# Patient Record
Sex: Female | Born: 1935 | Race: Black or African American | Hispanic: No | Smoking: Never smoker
Health system: Southern US, Community
[De-identification: ages and names within clinical notes are randomized; demographics above are authoritative.]

## PROBLEM LIST (undated history)

## (undated) DIAGNOSIS — E119 Type 2 diabetes mellitus without complications: Secondary | ICD-10-CM

## (undated) DIAGNOSIS — I5021 Acute systolic (congestive) heart failure: Secondary | ICD-10-CM

## (undated) DIAGNOSIS — I739 Peripheral vascular disease, unspecified: Secondary | ICD-10-CM

## (undated) DIAGNOSIS — M86179 Other acute osteomyelitis, unspecified ankle and foot: Secondary | ICD-10-CM

## (undated) DIAGNOSIS — E876 Hypokalemia: Secondary | ICD-10-CM

## (undated) DIAGNOSIS — I10 Essential (primary) hypertension: Secondary | ICD-10-CM

## (undated) DIAGNOSIS — F329 Major depressive disorder, single episode, unspecified: Secondary | ICD-10-CM

## (undated) DIAGNOSIS — F32A Depression, unspecified: Secondary | ICD-10-CM

## (undated) DIAGNOSIS — F419 Anxiety disorder, unspecified: Secondary | ICD-10-CM

## (undated) DIAGNOSIS — F0390 Unspecified dementia without behavioral disturbance: Secondary | ICD-10-CM

## (undated) DIAGNOSIS — E11610 Type 2 diabetes mellitus with diabetic neuropathic arthropathy: Secondary | ICD-10-CM

## (undated) DIAGNOSIS — E039 Hypothyroidism, unspecified: Secondary | ICD-10-CM

## (undated) HISTORY — DX: Hypokalemia: E87.6

## (undated) HISTORY — DX: Peripheral vascular disease, unspecified: I73.9

## (undated) HISTORY — DX: Type 2 diabetes mellitus without complications: E11.9

## (undated) HISTORY — PX: OTHER SURGICAL HISTORY: SHX169

## (undated) HISTORY — DX: Acute systolic (congestive) heart failure: I50.21

## (undated) HISTORY — DX: Essential (primary) hypertension: I10

## (undated) HISTORY — DX: Major depressive disorder, single episode, unspecified: F32.9

## (undated) HISTORY — DX: Depression, unspecified: F32.A

## (undated) HISTORY — PX: RECTAL POLYPECTOMY: SHX2309

## (undated) HISTORY — DX: Type 2 diabetes mellitus with diabetic neuropathic arthropathy: E11.610

## (undated) HISTORY — DX: Unspecified dementia, unspecified severity, without behavioral disturbance, psychotic disturbance, mood disturbance, and anxiety: F03.90

## (undated) HISTORY — DX: Anxiety disorder, unspecified: F41.9

## (undated) HISTORY — DX: Other acute osteomyelitis, unspecified ankle and foot: M86.179

## (undated) HISTORY — DX: Hypothyroidism, unspecified: E03.9

---

## 1970-09-20 HISTORY — PX: ENUCLEATION: SHX628

## 1998-08-10 ENCOUNTER — Emergency Department (HOSPITAL_COMMUNITY): Admission: EM | Admit: 1998-08-10 | Discharge: 1998-08-10 | Payer: Self-pay | Admitting: Emergency Medicine

## 2007-01-23 ENCOUNTER — Inpatient Hospital Stay (HOSPITAL_COMMUNITY): Admission: EM | Admit: 2007-01-23 | Discharge: 2007-01-27 | Payer: Self-pay | Admitting: Emergency Medicine

## 2007-01-24 ENCOUNTER — Encounter (INDEPENDENT_AMBULATORY_CARE_PROVIDER_SITE_OTHER): Payer: Self-pay | Admitting: Cardiology

## 2007-01-24 ENCOUNTER — Ambulatory Visit: Payer: Self-pay | Admitting: Vascular Surgery

## 2007-01-24 ENCOUNTER — Encounter: Payer: Self-pay | Admitting: Vascular Surgery

## 2007-09-19 ENCOUNTER — Encounter (HOSPITAL_BASED_OUTPATIENT_CLINIC_OR_DEPARTMENT_OTHER): Admission: RE | Admit: 2007-09-19 | Discharge: 2007-10-05 | Payer: Self-pay | Admitting: Surgery

## 2007-10-06 ENCOUNTER — Encounter (HOSPITAL_BASED_OUTPATIENT_CLINIC_OR_DEPARTMENT_OTHER): Admission: RE | Admit: 2007-10-06 | Discharge: 2008-01-04 | Payer: Self-pay | Admitting: Internal Medicine

## 2008-01-09 ENCOUNTER — Encounter (HOSPITAL_BASED_OUTPATIENT_CLINIC_OR_DEPARTMENT_OTHER): Admission: RE | Admit: 2008-01-09 | Discharge: 2008-04-08 | Payer: Self-pay | Admitting: Surgery

## 2008-04-17 ENCOUNTER — Ambulatory Visit (HOSPITAL_COMMUNITY): Admission: RE | Admit: 2008-04-17 | Discharge: 2008-04-17 | Payer: Self-pay | Admitting: Surgery

## 2008-04-17 ENCOUNTER — Encounter (HOSPITAL_BASED_OUTPATIENT_CLINIC_OR_DEPARTMENT_OTHER): Admission: RE | Admit: 2008-04-17 | Discharge: 2008-06-06 | Payer: Self-pay | Admitting: Internal Medicine

## 2008-05-29 ENCOUNTER — Ambulatory Visit: Payer: Self-pay | Admitting: Vascular Surgery

## 2008-06-07 ENCOUNTER — Ambulatory Visit (HOSPITAL_COMMUNITY): Admission: RE | Admit: 2008-06-07 | Discharge: 2008-06-07 | Payer: Self-pay | Admitting: Vascular Surgery

## 2009-06-25 ENCOUNTER — Inpatient Hospital Stay (HOSPITAL_COMMUNITY): Admission: EM | Admit: 2009-06-25 | Discharge: 2009-06-30 | Payer: Self-pay | Admitting: Emergency Medicine

## 2009-06-26 ENCOUNTER — Encounter (INDEPENDENT_AMBULATORY_CARE_PROVIDER_SITE_OTHER): Payer: Self-pay | Admitting: Internal Medicine

## 2009-06-26 ENCOUNTER — Ambulatory Visit: Payer: Self-pay | Admitting: Surgery

## 2009-06-26 ENCOUNTER — Ambulatory Visit: Payer: Self-pay | Admitting: Infectious Disease

## 2009-06-29 ENCOUNTER — Ambulatory Visit: Payer: Self-pay | Admitting: Infectious Disease

## 2009-07-02 ENCOUNTER — Encounter: Payer: Self-pay | Admitting: Infectious Disease

## 2009-07-07 ENCOUNTER — Encounter: Payer: Self-pay | Admitting: Infectious Disease

## 2009-07-09 ENCOUNTER — Telehealth: Payer: Self-pay | Admitting: Infectious Disease

## 2009-07-14 ENCOUNTER — Ambulatory Visit: Payer: Self-pay | Admitting: Cardiology

## 2009-07-14 ENCOUNTER — Inpatient Hospital Stay (HOSPITAL_COMMUNITY): Admission: EM | Admit: 2009-07-14 | Discharge: 2009-07-19 | Payer: Self-pay | Admitting: Emergency Medicine

## 2009-07-14 ENCOUNTER — Encounter: Payer: Self-pay | Admitting: Infectious Disease

## 2009-07-15 ENCOUNTER — Encounter: Payer: Self-pay | Admitting: Cardiology

## 2009-07-16 ENCOUNTER — Encounter: Payer: Self-pay | Admitting: Infectious Disease

## 2009-07-21 ENCOUNTER — Encounter: Payer: Self-pay | Admitting: Cardiology

## 2009-07-21 ENCOUNTER — Encounter: Payer: Self-pay | Admitting: Infectious Disease

## 2009-07-24 ENCOUNTER — Encounter: Payer: Self-pay | Admitting: Infectious Disease

## 2009-07-24 ENCOUNTER — Telehealth: Payer: Self-pay | Admitting: Infectious Disease

## 2009-07-25 ENCOUNTER — Encounter: Payer: Self-pay | Admitting: Cardiology

## 2009-07-29 ENCOUNTER — Encounter: Payer: Self-pay | Admitting: Infectious Disease

## 2009-07-30 DIAGNOSIS — M86179 Other acute osteomyelitis, unspecified ankle and foot: Secondary | ICD-10-CM | POA: Insufficient documentation

## 2009-08-01 DIAGNOSIS — I428 Other cardiomyopathies: Secondary | ICD-10-CM | POA: Insufficient documentation

## 2009-08-01 DIAGNOSIS — E039 Hypothyroidism, unspecified: Secondary | ICD-10-CM | POA: Insufficient documentation

## 2009-08-01 DIAGNOSIS — I739 Peripheral vascular disease, unspecified: Secondary | ICD-10-CM | POA: Insufficient documentation

## 2009-08-01 DIAGNOSIS — E119 Type 2 diabetes mellitus without complications: Secondary | ICD-10-CM

## 2009-08-01 DIAGNOSIS — I10 Essential (primary) hypertension: Secondary | ICD-10-CM | POA: Insufficient documentation

## 2009-08-01 DIAGNOSIS — I5021 Acute systolic (congestive) heart failure: Secondary | ICD-10-CM | POA: Insufficient documentation

## 2009-08-01 DIAGNOSIS — Z862 Personal history of diseases of the blood and blood-forming organs and certain disorders involving the immune mechanism: Secondary | ICD-10-CM | POA: Insufficient documentation

## 2009-08-01 DIAGNOSIS — F068 Other specified mental disorders due to known physiological condition: Secondary | ICD-10-CM

## 2009-08-01 DIAGNOSIS — F341 Dysthymic disorder: Secondary | ICD-10-CM | POA: Insufficient documentation

## 2009-08-01 DIAGNOSIS — Z8639 Personal history of other endocrine, nutritional and metabolic disease: Secondary | ICD-10-CM

## 2009-08-04 ENCOUNTER — Encounter: Payer: Self-pay | Admitting: Infectious Disease

## 2009-08-05 ENCOUNTER — Encounter: Payer: Self-pay | Admitting: Infectious Disease

## 2009-08-11 ENCOUNTER — Encounter: Payer: Self-pay | Admitting: Infectious Disease

## 2009-08-13 ENCOUNTER — Encounter: Payer: Self-pay | Admitting: Infectious Disease

## 2009-08-18 ENCOUNTER — Ambulatory Visit: Payer: Self-pay | Admitting: Infectious Disease

## 2009-08-18 DIAGNOSIS — R197 Diarrhea, unspecified: Secondary | ICD-10-CM | POA: Insufficient documentation

## 2009-08-18 DIAGNOSIS — M86679 Other chronic osteomyelitis, unspecified ankle and foot: Secondary | ICD-10-CM | POA: Insufficient documentation

## 2009-08-18 DIAGNOSIS — M1460 Charcot's joint, unspecified site: Secondary | ICD-10-CM | POA: Insufficient documentation

## 2009-08-18 DIAGNOSIS — E1169 Type 2 diabetes mellitus with other specified complication: Secondary | ICD-10-CM | POA: Insufficient documentation

## 2009-08-19 ENCOUNTER — Encounter: Payer: Self-pay | Admitting: Infectious Disease

## 2009-08-20 ENCOUNTER — Ambulatory Visit: Payer: Self-pay | Admitting: Cardiology

## 2009-08-20 LAB — CONVERTED CEMR LAB
ALT: 10 units/L (ref 0–35)
AST: 15 units/L (ref 0–37)
Albumin: 2.9 g/dL — ABNORMAL LOW (ref 3.5–5.2)
Alkaline Phosphatase: 75 units/L (ref 39–117)
Calcium: 8.3 mg/dL — ABNORMAL LOW (ref 8.4–10.5)
Chloride: 103 meq/L (ref 96–112)
HCT: 31.6 % — ABNORMAL LOW (ref 36.0–46.0)
Lymphocytes Relative: 21 % (ref 12–46)
Lymphs Abs: 2.5 10*3/uL (ref 0.7–4.0)
MCV: 83.6 fL (ref >=78.0)
Monocytes Relative: 9 % (ref 3–12)
Neutrophils Relative %: 66 % (ref 43–77)
Platelets: 520 10*3/uL — ABNORMAL HIGH (ref 150–400)
Potassium: 3.6 meq/L (ref 3.5–5.3)
RBC: 3.78 M/uL — ABNORMAL LOW (ref 3.87–5.11)
Sodium: 138 meq/L (ref 135–145)
Total Protein: 5.9 g/dL — ABNORMAL LOW (ref 6.0–8.3)
WBC: 12.2 10*3/uL — ABNORMAL HIGH (ref 4.0–10.5)

## 2009-08-29 ENCOUNTER — Encounter (HOSPITAL_BASED_OUTPATIENT_CLINIC_OR_DEPARTMENT_OTHER): Admission: RE | Admit: 2009-08-29 | Discharge: 2009-10-08 | Payer: Self-pay | Admitting: Internal Medicine

## 2009-10-06 ENCOUNTER — Ambulatory Visit: Payer: Self-pay | Admitting: Infectious Disease

## 2009-10-06 LAB — CONVERTED CEMR LAB
BUN: 16 mg/dL (ref 6–23)
Basophils Absolute: 0 10*3/uL (ref 0.0–0.1)
Basophils Relative: 0 % (ref 0–1)
Calcium: 9.3 mg/dL (ref 8.4–10.5)
Chloride: 95 meq/L — ABNORMAL LOW (ref 96–112)
Creatinine, Ser: 0.91 mg/dL (ref 0.40–1.20)
Eosinophils Absolute: 0.1 10*3/uL (ref 0.0–0.7)
MCHC: 31.7 g/dL (ref 30.0–36.0)
MCV: 87.8 fL (ref >=78.0)
Monocytes Relative: 7 % (ref 3–12)
Neutrophils Relative %: 68 % (ref 43–77)
Platelets: 391 10*3/uL (ref 150–400)
RBC: 3.92 M/uL (ref 3.87–5.11)
RDW: 15.5 % (ref 11.5–15.5)

## 2009-10-15 ENCOUNTER — Telehealth: Payer: Self-pay | Admitting: Infectious Disease

## 2009-11-03 ENCOUNTER — Inpatient Hospital Stay (HOSPITAL_COMMUNITY): Admission: EM | Admit: 2009-11-03 | Discharge: 2009-11-08 | Payer: Self-pay | Admitting: Internal Medicine

## 2009-11-03 ENCOUNTER — Encounter: Payer: Self-pay | Admitting: Emergency Medicine

## 2009-11-04 ENCOUNTER — Encounter (INDEPENDENT_AMBULATORY_CARE_PROVIDER_SITE_OTHER): Payer: Self-pay | Admitting: Internal Medicine

## 2009-11-05 ENCOUNTER — Ambulatory Visit: Payer: Self-pay | Admitting: Physical Medicine & Rehabilitation

## 2010-07-22 ENCOUNTER — Encounter (HOSPITAL_BASED_OUTPATIENT_CLINIC_OR_DEPARTMENT_OTHER): Admission: RE | Admit: 2010-07-22 | Discharge: 2010-08-07 | Payer: Self-pay | Admitting: Internal Medicine

## 2010-07-29 ENCOUNTER — Inpatient Hospital Stay (HOSPITAL_COMMUNITY): Admission: EM | Admit: 2010-07-29 | Discharge: 2010-08-07 | Payer: Self-pay | Admitting: Emergency Medicine

## 2010-07-30 ENCOUNTER — Encounter (INDEPENDENT_AMBULATORY_CARE_PROVIDER_SITE_OTHER): Payer: Self-pay | Admitting: Internal Medicine

## 2010-07-30 ENCOUNTER — Ambulatory Visit: Payer: Self-pay | Admitting: Surgery

## 2010-08-03 ENCOUNTER — Encounter (INDEPENDENT_AMBULATORY_CARE_PROVIDER_SITE_OTHER): Payer: Self-pay | Admitting: Internal Medicine

## 2010-10-20 NOTE — Letter (Signed)
Summary: Physician Query Form  Physician Query Form   Imported By: Kassie Mends 10/06/2009 09:43:47  _____________________________________________________________________  External Attachment:    Type:   Image     Comment:   External Document

## 2010-10-20 NOTE — Assessment & Plan Note (Signed)
Summary: 6 WEEK CHECK UP/CFB   Visit Type:  Follow-up  CC:  6 week check up.  History of Present Illness: 77 year old with dementia, diabetes, charcot foot admitted to [name] in October with acute flare of chronic osteomyelitis of her dorsal foot on the right. She had superficial swabs obtained and was started on broad spectrum vancomycin and zosyn. I changed her to merrem and vancomycin to cover her broadly for MRSA, pseudomonas and anerobes. In the interval her vancomycin was somehow stopped, but I had continued her on meropenem. She then developed profuse watery diarrhea  and I had the meropenem stopped and tried to convince the pt to come in to the hospital for evaluatoin. I saw her in clinic and treated her empirically with flagyl for c difficile colitiis (presumptive). Her diarrhe improved. She has continued to be followed in wound care clinic and presents to ID clinic today for followup.   Current Allergies (reviewed today): No known allergies  Past History:  Family History: Last updated: 08/01/2009   Mom with diabetes type 2 and Alzheimer's.  Father with   diabetes and Alzheimer's.      Social History: Last updated: 08/18/2009 The patient lives in [place] with her daughter.  She   has no significant tobacco abuse history.  She has a history of alcohol  abuse, quitting in 1992 and remains abstinent.  No illicit drug use or  herbal medication use.   Past Medical History: Current Problems:  CONGESTIVE HEART FAILURE, ACUTE, SYSTOLIC (ICD-428.21) CARDIOMYOPATHY (ICD-425.4)-  Presumed nonischemic PERIPHERAL VASCULAR DISEASE (ICD-443.9) HYPERTENSION, UNSPECIFIED (ICD-401.9) DIABETES MELLITUS, TYPE II (ICD-250.00) HYPOTHYROIDISM (ICD-244.9) DEMENTIA (ICD-294.8) ANXIETY DEPRESSION (ICD-300.4) HYPOKALEMIA, HX OF (ICD-V12.2) ACUTE OSTEOMYELITIS, ANKLE AND FOOT (ICD-730.07) HX OF CHARCOT FOOT presumptive diagnosis of c difficile colitis  Past Surgical History: Reviewed  history from 08/01/2009 and no changes required.  1. Left eye removed in 1972.   2. Rectal polyps removed.   Current Medications (verified): 1)  Pedi-Dri 100000 Unit/gm Powd (Nystatin) .... Apply Three Times Dailiy To Area of Rash, Dispense 30day Supply 2)  Aspir-Low 81 Mg Tbec (Aspirin) .Marland Kitchen.. 1 Tablet By Mouth 3)  Carvedilol 12.5 Mg Tabs (Carvedilol) .... Take One Tablet By Mouth Twice A Day 4)  Digoxin 0.125 Mg Tabs (Digoxin) .Marland Kitchen.. 1 Tablet By Mouth 5)  Lasix 40 Mg Tabs (Furosemide) .Marland Kitchen.. 1 Tablet By Mouth As Needed 6)  Lantus Solostar 100 Unit/ml Soln (Insulin Glargine) 7)  Proair Hfa 108 (90 Base) Mcg/act Aers (Albuterol Sulfate) .... 2 Puffs Four Times Daily As Needed 8)  Potassium Chloride 20 Meq Pack (Potassium Chloride) .... 2 Tablets By Mouth 9)  Acetaminophen 500 Mg Tabs (Acetaminophen) .Marland Kitchen.. 1 Tablet By Mouth Every 6 Hours As Needed 10)  Venlafaxine Hcl 75 Mg Xr24h-Tab (Venlafaxine Hcl) .Marland Kitchen.. 1 Capsule By Mouth Daily 11)  Diphenoxylate-Atropine 2.5-0.025 Mg Tabs (Diphenoxylate-Atropine) .... By Mouth As Needed 12)  Vicodin 5-500 Mg Tabs (Hydrocodone-Acetaminophen) .Marland Kitchen.. 1 Tablet By Mouth Every 6 Hours As Needed  Allergies (verified): No Known Drug Allergies   Family History: Reviewed history from 08/01/2009 and no changes required.   Mom with diabetes type 2 and Alzheimer's.  Father with   diabetes and Alzheimer's.      Social History: Reviewed history from 08/18/2009 and no changes required. The patient lives in [place] with her daughter.  She   has no significant tobacco abuse history.  She has a history of alcohol  abuse, quitting in 1992 and remains abstinent.  No illicit drug  use or  herbal medication use.   Review of Systems  The patient denies anorexia, fever, weight loss, weight gain, vision loss, decreased hearing, hoarseness, chest pain, syncope, dyspnea on exertion, peripheral edema, prolonged cough, headaches, hemoptysis, abdominal pain, melena, hematochezia,  severe indigestion/heartburn, hematuria, incontinence, genital sores, muscle weakness, suspicious skin lesions, transient blindness, difficulty walking, depression, unusual weight change, abnormal bleeding, and enlarged lymph nodes.    Vital Signs:  Patient profile:   77 year old female Height:      57 inches (144.78 cm) Temp:     98.9 degrees F (37.17 degrees C) oral Pulse rate:   102 / minute BP sitting:   111 / 67  (left arm)  Vitals Entered By: Baxter Hire) (October 06, 2009 10:35 AM) CC: 6 week check up Is Patient Diabetic? Yes Did you bring your meter with you today? No Pain Assessment Patient in pain? no      Nutritional Status Detail appetite is eating okay per patient  Does patient need assistance? Functional Status Self care Ambulation Wheelchair   Physical Exam  General:  alert.  alert, well-nourished, and well-hydrated.   Head:  normocephalic and atraumatic.   Eyes:  misssing eye on the right an dlid is chronically shut left eye is reactive to light Ears:  no external deformities and ear piercing(s) noted.   Nose:  no external erythema and no nasal discharge.   Mouth:  pharynx pink and moist, no erythema, and no exudates.  slightly dry mucous membranes Neck:  supple and full ROM.   Lungs:  normal respiratory effort, no crackles, and no wheezes.   Heart:  normal rate, regular rhythm, no murmur, no gallop, and no rub.   Abdomen:  soft, non-tender, normal bowel sounds, no distention, and no masses.   Msk:  she has open wound on her sole of her right foot with granulation tissue but also some malodorous discharge. she has charcot deformities bilaterally Extremities:  see description obove, Neurologic:  alert,  pleasant but memory poor insight poor Skin:  see ms exam section Psych:  pleaseant enough but at times dysphoric anxious appearing   Impression & Recommendations:  Problem # 1:  CHRONIC OSTEOMYELITIS ANKLE AND FOOT (ICD-730.17) Assessment Comment  Only  I have once again laid out options to the pt and daughter continued local wound care with oral and at times parenteral antibiotics when infection flare with risk that one day a severe infection could result in bacteremia and potentially death vs more definitive therapy with BKA with downside of her losing her foot. She is going to be seen by Hattie Perch with orthopedics in next few days. The following medications were removed from the medication list:    Metronidazole 500 Mg Tabs (Metronidazole) .Marland Kitchen... Take 1 tablet by mouth three times a day for 2 weeks Her updated medication list for this problem includes:    Aspir-low 81 Mg Tbec (Aspirin) .Marland Kitchen... 1 tablet by mouth    Acetaminophen 500 Mg Tabs (Acetaminophen) .Marland Kitchen... 1 tablet by mouth every 6 hours as needed    Vicodin 5-500 Mg Tabs (Hydrocodone-acetaminophen) .Marland Kitchen... 1 tablet by mouth every 6 hours as needed  Orders: T-Basic Metabolic Panel [phone]) T-CBC w/Diff [id number]) T-C-Reactive Protein [phone]) T-Sed Rate (Automated) [phone]) Est. Patient Level IV (25956)  Problem # 2:  DIARRHEA (ICD-787.91) Assessment: Comment Only resolved, could have been c difficile The following medications were removed from the medication list:    Metronidazole 500 Mg Tabs (Metronidazole) .Marland Kitchen... Take 1  tablet by mouth three times a day for 2 weeks Her updated medication list for this problem includes:    Diphenoxylate-atropine 2.5-0.025 Mg Tabs (Diphenoxylate-atropine) ..... By mouth as needed  Problem # 3:  HYPERTENSION, UNSPECIFIED (ICD-401.9)  well controlled at present Her updated medication list for this problem includes:    Carvedilol 12.5 Mg Tabs (Carvedilol) .Marland Kitchen... Take one tablet by mouth twice a day    Lasix 40 Mg Tabs (Furosemide) .Marland Kitchen... 1 tablet by mouth as needed  BP today: 111/67 Prior BP: 110/60 (08/20/2009)  Labs Reviewed: K+: 3.6 (08/19/2009) Creat: : 1.08 (08/19/2009)     Orders: Est. Patient Level IV  (30865)  Problem # 4:  DIABETES MELLITUS, TYPE II (ICD-250.00)  controllling important to wound care Her updated medication list for this problem includes:    Aspir-low 81 Mg Tbec (Aspirin) .Marland Kitchen... 1 tablet by mouth    Lantus Solostar 100 Unit/ml Soln (Insulin glargine)  Orders: Est. Patient Level IV (78469)  Problem # 5:  DEMENTIA (ICD-294.8) clearly daughter needs to be involved in major decisions Est. Patient Level IV (62952)  Patient Instructions: 1)  Have Dr. Lajoyce Corners give me a call/send me a note 2)  Continue wound care and I am holidn goff on antibiotics for now pending evaluation by Dr. Lajoyce Corners to discuss surgical options 3)  Make fu appt with Dr. Daiva Eves in 6-8 weeks Process Orders Check Orders Results:     [facility]: Check successful Tests Sent for requisitioning (October 06, 2009 7:56 PM):     10/06/2009: [facility] -- T-Basic Metabolic Panel [phone] (signed)     10/06/2009: [facility] -- T-CBC w/Diff [[id number]] (signed)     10/06/2009: [facility] -- T-C-Reactive Protein [id number] (signed)     10/06/2009: [facility] -- T-Sed Rate (Automated) [[id number]] (signed)

## 2010-10-20 NOTE — Progress Notes (Signed)
Summary: phone note-TY  Phone Note Call from Patient   Caller: Daughter Call For: [name] Dam MD Reason for Call: Acute Illness Summary of Call: Patient's daughter called and stated that the patient has a foul odor coming from the wound x 2 days. She stated that her appt with the wound care center isn't until 10/22/2009. She wants to know does she need to have an appt. with you to examine the wound. She stated the wound doesn't look "that bad" but could use debridment per the daughter. Initial call taken by: Starleen Arms CMA,  October 15, 2009 4:26 PM  Follow-up for Phone Call        I think she may need to go to ED for evaluation. I am not going to be able to squeeze her in tomorrow nor am I going to be able to do so on Monday. I could see her on Wednesday but based on the description and also based on her labs last time I WOULD NOT BE SURPRISED if her chronic infection is getting worse. She probably needs to be admitted for IV antibiotics YET AGAIN. Follow-up by: Acey Lav MD,  October 16, 2009 8:16 PM  Additional Follow-up for Phone Call Additional follow up Details #1::        Patient going to [facility] orthopedics tomorrow, they will evaluate her leg. Additional Follow-up by: Starleen Arms CMA,  October 21, 2009 11:22 AM

## 2010-10-28 ENCOUNTER — Emergency Department (HOSPITAL_COMMUNITY): Payer: Medicare Other

## 2010-10-28 ENCOUNTER — Inpatient Hospital Stay (HOSPITAL_COMMUNITY)
Admission: EM | Admit: 2010-10-28 | Discharge: 2010-11-03 | DRG: 208 | Disposition: A | Discharge status: Home Health | Payer: Medicare Other | Attending: Family Medicine | Admitting: Family Medicine

## 2010-10-28 DIAGNOSIS — I509 Heart failure, unspecified: Secondary | ICD-10-CM | POA: Diagnosis present

## 2010-10-28 DIAGNOSIS — J9600 Acute respiratory failure, unspecified whether with hypoxia or hypercapnia: Principal | ICD-10-CM | POA: Diagnosis present

## 2010-10-28 DIAGNOSIS — E119 Type 2 diabetes mellitus without complications: Secondary | ICD-10-CM | POA: Diagnosis present

## 2010-10-28 DIAGNOSIS — Z794 Long term (current) use of insulin: Secondary | ICD-10-CM

## 2010-10-28 DIAGNOSIS — E875 Hyperkalemia: Secondary | ICD-10-CM | POA: Diagnosis present

## 2010-10-28 DIAGNOSIS — I739 Peripheral vascular disease, unspecified: Secondary | ICD-10-CM | POA: Diagnosis present

## 2010-10-28 DIAGNOSIS — I5023 Acute on chronic systolic (congestive) heart failure: Secondary | ICD-10-CM | POA: Diagnosis present

## 2010-10-28 DIAGNOSIS — F068 Other specified mental disorders due to known physiological condition: Secondary | ICD-10-CM | POA: Diagnosis present

## 2010-10-28 DIAGNOSIS — N179 Acute kidney failure, unspecified: Secondary | ICD-10-CM | POA: Diagnosis present

## 2010-10-28 DIAGNOSIS — S88119A Complete traumatic amputation at level between knee and ankle, unspecified lower leg, initial encounter: Secondary | ICD-10-CM

## 2010-10-28 DIAGNOSIS — Z7982 Long term (current) use of aspirin: Secondary | ICD-10-CM

## 2010-10-28 DIAGNOSIS — J690 Pneumonitis due to inhalation of food and vomit: Secondary | ICD-10-CM | POA: Diagnosis present

## 2010-10-28 DIAGNOSIS — E871 Hypo-osmolality and hyponatremia: Secondary | ICD-10-CM | POA: Diagnosis present

## 2010-10-28 DIAGNOSIS — I214 Non-ST elevation (NSTEMI) myocardial infarction: Secondary | ICD-10-CM | POA: Diagnosis present

## 2010-10-28 DIAGNOSIS — E039 Hypothyroidism, unspecified: Secondary | ICD-10-CM | POA: Diagnosis present

## 2010-10-28 DIAGNOSIS — I428 Other cardiomyopathies: Secondary | ICD-10-CM | POA: Diagnosis present

## 2010-10-28 LAB — POCT I-STAT, CHEM 8
BUN: 12 mg/dL (ref 6–23)
Calcium, Ion: 1.04 mmol/L — ABNORMAL LOW (ref 1.12–1.32)
Chloride: 99 mEq/L (ref 96–112)
HCT: 45 % (ref 36.0–46.0)
Sodium: 125 mEq/L — ABNORMAL LOW (ref 135–145)
TCO2: 20 mmol/L (ref 0–100)

## 2010-10-28 LAB — CBC
HCT: 40.5 % (ref 36.0–46.0)
MCHC: 32.6 g/dL (ref 30.0–36.0)
MCV: 83.7 fL (ref 78.0–100.0)
RDW: 15.1 % (ref 11.5–15.5)

## 2010-10-28 LAB — DIFFERENTIAL
Basophils Absolute: 0 10*3/uL (ref 0.0–0.1)
Eosinophils Absolute: 0.2 10*3/uL (ref 0.0–0.7)
Eosinophils Relative: 2 % (ref 0–5)
Lymphocytes Relative: 46 % (ref 12–46)
Monocytes Absolute: 0.7 10*3/uL (ref 0.1–1.0)

## 2010-10-28 LAB — POCT I-STAT 3, ART BLOOD GAS (G3+)
Acid-base deficit: 6 mmol/L — ABNORMAL HIGH (ref 0.0–2.0)
Bicarbonate: 19.2 mEq/L — ABNORMAL LOW (ref 20.0–24.0)
Patient temperature: 98.6
pH, Arterial: 7.317 — ABNORMAL LOW (ref 7.350–7.400)
pO2, Arterial: 474 mmHg — ABNORMAL HIGH (ref 80.0–100.0)

## 2010-10-28 LAB — POCT CARDIAC MARKERS: Myoglobin, poc: 163 ng/mL (ref 12–200)

## 2010-10-29 ENCOUNTER — Inpatient Hospital Stay (HOSPITAL_COMMUNITY): Payer: Medicare Other

## 2010-10-29 DIAGNOSIS — R7989 Other specified abnormal findings of blood chemistry: Secondary | ICD-10-CM

## 2010-10-29 DIAGNOSIS — I509 Heart failure, unspecified: Secondary | ICD-10-CM

## 2010-10-29 DIAGNOSIS — R55 Syncope and collapse: Secondary | ICD-10-CM

## 2010-10-29 DIAGNOSIS — I369 Nonrheumatic tricuspid valve disorder, unspecified: Secondary | ICD-10-CM

## 2010-10-29 DIAGNOSIS — J9600 Acute respiratory failure, unspecified whether with hypoxia or hypercapnia: Secondary | ICD-10-CM

## 2010-10-29 DIAGNOSIS — I498 Other specified cardiac arrhythmias: Secondary | ICD-10-CM

## 2010-10-29 DIAGNOSIS — J690 Pneumonitis due to inhalation of food and vomit: Secondary | ICD-10-CM

## 2010-10-29 LAB — CARDIAC PANEL(CRET KIN+CKTOT+MB+TROPI)
CK, MB: 26.6 ng/mL (ref 0.3–4.0)
CK, MB: 34 ng/mL (ref 0.3–4.0)
Relative Index: 10.7 — ABNORMAL HIGH (ref 0.0–2.5)
Relative Index: 10.9 — ABNORMAL HIGH (ref 0.0–2.5)
Total CK: 249 U/L — ABNORMAL HIGH (ref 7–177)
Total CK: 313 U/L — ABNORMAL HIGH (ref 7–177)
Troponin I: 10.2 ng/mL (ref 0.00–0.06)

## 2010-10-29 LAB — PROTIME-INR: INR: 1.24 (ref 0.00–1.49)

## 2010-10-29 LAB — URINE MICROSCOPIC-ADD ON

## 2010-10-29 LAB — COMPREHENSIVE METABOLIC PANEL
ALT: 27 U/L (ref 0–35)
Albumin: 2.7 g/dL — ABNORMAL LOW (ref 3.5–5.2)
Alkaline Phosphatase: 118 U/L — ABNORMAL HIGH (ref 39–117)
BUN: 12 mg/dL (ref 6–23)
CO2: 21 mEq/L (ref 19–32)
Creatinine, Ser: 1.17 mg/dL (ref 0.4–1.2)
GFR calc non Af Amer: 44 mL/min — ABNORMAL LOW (ref >60)
Glucose, Bld: 156 mg/dL — ABNORMAL HIGH (ref 70–99)
Potassium: 5.7 mEq/L — ABNORMAL HIGH (ref 3.5–5.1)
Sodium: 126 mEq/L — ABNORMAL LOW (ref 135–145)
Total Protein: 6 g/dL (ref 6.0–8.3)

## 2010-10-29 LAB — BLOOD GAS, ARTERIAL
Acid-base deficit: 2 mmol/L (ref 0.0–2.0)
Bicarbonate: 21.2 mEq/L (ref 20.0–24.0)
FIO2: 0.4 %
O2 Saturation: 99.4 %
TCO2: 22.1 mmol/L (ref 0–100)
pO2, Arterial: 157 mmHg — ABNORMAL HIGH (ref 80.0–100.0)

## 2010-10-29 LAB — GLUCOSE, CAPILLARY
Glucose-Capillary: 124 mg/dL — ABNORMAL HIGH (ref 70–99)
Glucose-Capillary: 146 mg/dL — ABNORMAL HIGH (ref 70–99)
Glucose-Capillary: 147 mg/dL — ABNORMAL HIGH (ref 70–99)
Glucose-Capillary: 43 mg/dL — CL (ref 70–99)
Glucose-Capillary: 54 mg/dL — ABNORMAL LOW (ref 70–99)
Glucose-Capillary: 59 mg/dL — ABNORMAL LOW (ref 70–99)

## 2010-10-29 LAB — URINALYSIS, ROUTINE W REFLEX MICROSCOPIC
Bilirubin Urine: NEGATIVE
Specific Gravity, Urine: 1.011 (ref 1.005–1.030)
Urine Glucose, Fasting: NEGATIVE mg/dL
Urobilinogen, UA: 1 mg/dL (ref 0.0–1.0)
pH: 6.5 (ref 5.0–8.0)

## 2010-10-29 LAB — BASIC METABOLIC PANEL
CO2: 23 mEq/L (ref 19–32)
Calcium: 9 mg/dL (ref 8.4–10.5)
Calcium: 8.8 mg/dL (ref 8.4–10.5)
Creatinine, Ser: 1.24 mg/dL — ABNORMAL HIGH (ref 0.4–1.2)
GFR calc Af Amer: 50 mL/min — ABNORMAL LOW (ref >60)
GFR calc Af Amer: 57 mL/min — ABNORMAL LOW (ref >60)
GFR calc Af Amer: 56 mL/min — ABNORMAL LOW (ref >60)
GFR calc non Af Amer: 42 mL/min — ABNORMAL LOW (ref >60)
GFR calc non Af Amer: 47 mL/min — ABNORMAL LOW (ref >60)
GFR calc non Af Amer: 46 mL/min — ABNORMAL LOW (ref >60)
Glucose, Bld: 164 mg/dL — ABNORMAL HIGH (ref 70–99)
Glucose, Bld: 70 mg/dL (ref 70–99)
Glucose, Bld: 70 mg/dL (ref 70–99)
Potassium: 4.5 mEq/L (ref 3.5–5.1)
Potassium: 4 mEq/L (ref 3.5–5.1)
Sodium: 126 mEq/L — ABNORMAL LOW (ref 135–145)
Sodium: 128 mEq/L — ABNORMAL LOW (ref 135–145)
Sodium: 131 mEq/L — ABNORMAL LOW (ref 135–145)

## 2010-10-29 LAB — POCT I-STAT 3, ART BLOOD GAS (G3+)
O2 Saturation: 94 %
TCO2: 20 mmol/L (ref 0–100)
pCO2 arterial: 25.9 mmHg — ABNORMAL LOW (ref 35.0–45.0)
pO2, Arterial: 63 mmHg — ABNORMAL LOW (ref 80.0–100.0)

## 2010-10-29 LAB — TSH: TSH: 16.326 u[IU]/mL — ABNORMAL HIGH (ref 0.350–4.500)

## 2010-10-29 LAB — BRAIN NATRIURETIC PEPTIDE: Pro B Natriuretic peptide (BNP): 1257 pg/mL — ABNORMAL HIGH (ref 0.0–100.0)

## 2010-10-29 LAB — CBC
HCT: 36.6 % (ref 36.0–46.0)
MCV: 81.5 fL (ref 78.0–100.0)
RBC: 4.49 MIL/uL (ref 3.87–5.11)
WBC: 8.9 10*3/uL (ref 4.0–10.5)

## 2010-10-29 LAB — CK TOTAL AND CKMB (NOT AT ARMC): CK, MB: 2.9 ng/mL (ref 0.3–4.0)

## 2010-10-29 LAB — APTT: aPTT: 30 seconds (ref 24–37)

## 2010-10-29 LAB — MRSA PCR SCREENING: MRSA by PCR: NEGATIVE

## 2010-10-30 ENCOUNTER — Inpatient Hospital Stay (HOSPITAL_COMMUNITY): Payer: Medicare Other

## 2010-10-30 LAB — BASIC METABOLIC PANEL
BUN: 11 mg/dL (ref 6–23)
Calcium: 8.6 mg/dL (ref 8.4–10.5)
Calcium: 8.8 mg/dL (ref 8.4–10.5)
GFR calc Af Amer: 56 mL/min — ABNORMAL LOW (ref >60)
GFR calc non Af Amer: 43 mL/min — ABNORMAL LOW (ref >60)
GFR calc non Af Amer: 46 mL/min — ABNORMAL LOW (ref >60)
Glucose, Bld: 62 mg/dL — ABNORMAL LOW (ref 70–99)
Potassium: 3.1 mEq/L — ABNORMAL LOW (ref 3.5–5.1)
Sodium: 135 mEq/L (ref 135–145)
Sodium: 137 mEq/L (ref 135–145)

## 2010-10-30 LAB — URINE CULTURE

## 2010-10-30 LAB — GLUCOSE, CAPILLARY
Glucose-Capillary: 196 mg/dL — ABNORMAL HIGH (ref 70–99)
Glucose-Capillary: 111 mg/dL — ABNORMAL HIGH (ref 70–99)

## 2010-10-30 LAB — CBC
Hemoglobin: 12.9 g/dL (ref 12.0–15.0)
MCHC: 34 g/dL (ref 30.0–36.0)
RDW: 15.5 % (ref 11.5–15.5)
WBC: 10.4 10*3/uL (ref 4.0–10.5)

## 2010-10-31 ENCOUNTER — Inpatient Hospital Stay (HOSPITAL_COMMUNITY): Payer: Medicare Other

## 2010-10-31 LAB — MRSA PCR SCREENING: MRSA by PCR: NEGATIVE

## 2010-10-31 LAB — CBC
MCH: 27.2 pg (ref 26.0–34.0)
MCHC: 33.1 g/dL (ref 30.0–36.0)
Platelets: 199 10*3/uL (ref 150–400)
RDW: 15.8 % — ABNORMAL HIGH (ref 11.5–15.5)

## 2010-10-31 LAB — BASIC METABOLIC PANEL
CO2: 27 mEq/L (ref 19–32)
CO2: 31 mEq/L (ref 19–32)
Calcium: 8.7 mg/dL (ref 8.4–10.5)
Chloride: 98 mEq/L (ref 96–112)
Creatinine, Ser: 1.08 mg/dL (ref 0.4–1.2)
Creatinine, Ser: 1.13 mg/dL (ref 0.4–1.2)
GFR calc Af Amer: 59 mL/min — ABNORMAL LOW (ref >60)
GFR calc Af Amer: 56 mL/min — ABNORMAL LOW (ref >60)

## 2010-10-31 LAB — GLUCOSE, CAPILLARY
Glucose-Capillary: 100 mg/dL — ABNORMAL HIGH (ref 70–99)
Glucose-Capillary: 134 mg/dL — ABNORMAL HIGH (ref 70–99)
Glucose-Capillary: 155 mg/dL — ABNORMAL HIGH (ref 70–99)
Glucose-Capillary: 97 mg/dL (ref 70–99)

## 2010-11-01 ENCOUNTER — Inpatient Hospital Stay (HOSPITAL_COMMUNITY): Payer: Medicare Other

## 2010-11-01 LAB — CULTURE, RESPIRATORY W GRAM STAIN: Culture: NORMAL

## 2010-11-01 LAB — BASIC METABOLIC PANEL
BUN: 8 mg/dL (ref 6–23)
Chloride: 98 mEq/L (ref 96–112)
GFR calc Af Amer: 55 mL/min — ABNORMAL LOW (ref >60)
Potassium: 3 mEq/L — ABNORMAL LOW (ref 3.5–5.1)

## 2010-11-01 LAB — GLUCOSE, CAPILLARY
Glucose-Capillary: 132 mg/dL — ABNORMAL HIGH (ref 70–99)
Glucose-Capillary: 137 mg/dL — ABNORMAL HIGH (ref 70–99)
Glucose-Capillary: 132 mg/dL — ABNORMAL HIGH (ref 70–99)
Glucose-Capillary: 199 mg/dL — ABNORMAL HIGH (ref 70–99)

## 2010-11-02 DIAGNOSIS — I214 Non-ST elevation (NSTEMI) myocardial infarction: Secondary | ICD-10-CM

## 2010-11-02 LAB — BASIC METABOLIC PANEL
CO2: 26 mEq/L (ref 19–32)
Chloride: 100 mEq/L (ref 96–112)
GFR calc Af Amer: 55 mL/min — ABNORMAL LOW (ref >60)
Sodium: 136 mEq/L (ref 135–145)

## 2010-11-02 LAB — GLUCOSE, CAPILLARY

## 2010-11-02 NOTE — H&P (Signed)
NAME:  [name], [name]               ACCOUNT NO.:  [id number]  MEDICAL RECORD NO.:  [id number]           PATIENT TYPE:  I  LOCATION:  2316                         FACILITY:  [facility]  PHYSICIAN:  Nelda Bucks, MD DATE OF BIRTH:  07-Oct-1928  DATE OF ADMISSION:  10/28/2010 DATE OF DISCHARGE:                             HISTORY & PHYSICAL   HISTORY OF PRESENT ILLNESS:  This is a 77 year old African American female with a history of status post bilateral amputations, with a history of gangrene on her left lower extremity recently in November with a history of nonischemic cardiomyopathy, ejection fraction noted be 20-25% who was in her normal state of health noted the day prior with "mild congestion" but no fevers, no chills, no chest pain, and at that time no nausea, no vomiting, no diarrhea, and no abdominal pain.  The patient was having discussion with her daughter.  Daughter went in to go to sleep, felt she heard her mother about 10 minutes later speaking, went back into the room where she found her unresponsive and with agonal respirations with frank vomitus and diarrhea at the scene.  EMS was contacted, arrived, and was unable to obtain an airway and bagged the patient to [facility] emergency room.  In the emergency room, the patient was noted to be bradycardic, but resolved, required emergent intubation by the ER physician and noted to have some secretions in the oropharynx.  The patient remained to be normotensive and had required succinylcholine and Etomidate for intubation.  There were no seizures noted according to daughter, no other primary neurologic phenomenon, and no chest pain.  Here in the emergency room found to have hyperkalemia mild and called by Critical Care for admission secondary to acute respiratory failure.  ALLERGIES:  No known drug allergies.  PAST MEDICAL AND SURGICAL HISTORY: 1. Left leg gangrene status post left below-knee  amputation. 2. Urinary tract infections in November 2011. 3. Acute renal failure that resolved in November 2011. 4. Diabetes. 5. Multifactorial anemia. 6. Underlying presumed nonischemic cardiomyopathy. 7. Status post right below-knee amputation, February 2011. 8. Hypertension. 9. History of peripheral vascular disease. 10.History of hypothyroidism. 11.History of dementia.  ACTIVE MEDICATION LIST:  Upon discharge in November included: 1. Aspirin 81 mg. 2. Coreg 3.125 mg. 3. Vicodin 1-2 tablets q.4 p.r.n. which has been discontinued. 4. Lantus 20 units at bedtime. 5. Amlodipine 5 mg daily. 6. Glimeperide 4 mg. 7. Glucerna shake. 8. Levothyroxine 75 mcg daily.  SOCIAL HISTORY:  She is a widow, has two daughters, lives with one of her daughters.  Has been in normal state of health.  No known drugs and no active smoking.  FAMILY HISTORY:  Noncontributory.  REVIEW OF SYSTEMS:  Unobtainable except for what is above as listed from the history provided by the daughter.  PHYSICAL EXAMINATION:  VITAL SIGNS:  Afebrile state, heart rate of 66, mean arterial pressure of 79 and respiratory ventilator of 14, and saturating 94-97%. GENERAL:  The patient to be intubated and becoming somewhat agitated. NECK:  Jugular venous distention.  No thyromegaly. LUNGS:  Crackles at the bases and rhonchi. CARDIOVASCULAR:  S1-S2.  Regular rate and rhythm but distant heart sounds. ABDOMEN:  A belly which is obese.  Bowel sounds are present, hypoactive, nontender, and nondistended. EXTREMITIES:  Bilateral amputations without any active wound infection. CNS:  Movement of upper extremities.  No seizures noted.  RAST scale approximately negative to agitation, not following commands, and moving upper extremities.  LABORATORY ASSESSMENT:  PH of 7.31, pCO2 of 37, and pO2 of 474.  White count is normal at 9.6, hemoglobin 15.3, hematocrit 45, platelet count 187, and normal differential.  Sodium is low 127, K  is 5.7, chloride is 94, CO2 21, glucose 156, BUN 10, and creatinine 1.19.  Alk phos 118, AST is 57, ALT is 27, total protein 7, albumin 3.2, and calcium 8.9. Troponin negative with a bedside marker less than 0.05 with a BNP of 1257.  Occult fecal and occult blood is negative.  Portable chest x-ray reveals bibasilar effusions, small-to-moderate with interstitial edema. Endotracheal tube is high which will be advanced 4 cm.  CT scan of the head I reviewed preliminarily with the radiologist does not reveal any intracranial hemorrhage, no acute process, although awaiting official report.  EKG is pending at this time.  ASSESSMENT AND PLAN: 1. Acute respiratory 2. Likely aspiration pneumonia event. 3. Rule out primary arrhythmia with syncope in the setting of     hyperkalemia. 4. Rule out primary neurologic event stroke. 5. Nonischemic cardiomyopathy. 6. Hyponatremia/hyperkalemia. 7. Rule out ischemic event. 8. Status post amputation bilaterally. 9. Diabetes. 10.Hypothyroidism. 11.Dementia. 12.Likely overload pulmonary edema state, congestive heart failure     exacerbation.  PLAN: 1. Neuro:  Stat head CT has been done, await official report.  If     negative for intracranial hemorrhage, subcu heparin will be     provided.  Likely Fentanyl may be required if agitation ensues. 2. Respiratory:  Tidal volume will be set at 450, PEEP at 5, FIO2 will     be reduced to 40%, rate is at 20.  ABG will be followed up now, may     need Lasix with BNP elevation, pulmonary edema, hyponatremia, and     hyperkalemia.  Portable chest x-ray has been reviewed and     endotracheal tube will be advanced and repeated in the morning     time.  ABG also will be repeated in the morning time. 3. Cardiovascular:  Troponin will be sent.  EKG will be assessed.     Echocardiogram will be assessed to evaluate for new regional wall     motion abnormalities.  Change in her ejection fraction was noted to     be a  likely nonischemic cardiomyopathy and to assess for increasing     pericardial effusion, noted small on last echo.  Lasix likely will     be provided for negative balance as the patient tolerates.  May     need central line placement at this time with normotension and     current reasonable IV access.  Cardiology may be required for     consultation.  We will correct potassium and keep on telemetry     monitoring. 4. Renal:  The patient presents with hyponatremia and hyperkalemia,     BNP elevation, and likely pulmonary edema.  Lasix will be provided     at 40 q.12 hourly x3 doses.  Chemistry panel will be followed up     for potassium resolution.  Kayexalate will be provided if does not     resolve and bicarbonate  drip if not resolving. 5. GI:  The patient will be maintained to be n.p.o.  LFTs will be     reassessed.  PPI will be provided. 6. ID:  The patient afebrile at this time, although likely an     aspiration event has occurred.  Ceftriaxone and Flagyl will be     provided.  Urinalysis will be sent. 7. Hematology:  The patient with no extremities to place SCDs.  If     head CT is negative, we will add subcu heparin 5000 t.i.d. for DVT     prevention. 8. Endocrine:  Sliding scale insulin will be provided.  CBG, please     note was 147 at the scene.  There were no apparent hypoglycemic     event that has triggered this change in mental status/syncope.  TSH     will be assessed.  This patient is critically ill and required     approximately 15 minutes of my constant attention and care.     Nelda Bucks, MD     DJF/MEDQ  D:  10/29/2010  T:  10/29/2010  Job:  [id number]  cc:   Fleet Contras, M.D.  Electronically Signed by Rory Percy MD on 11/02/2010 09:40:47 PM

## 2010-11-03 ENCOUNTER — Telehealth: Payer: Self-pay | Admitting: Cardiology

## 2010-11-03 ENCOUNTER — Encounter (INDEPENDENT_AMBULATORY_CARE_PROVIDER_SITE_OTHER): Payer: Self-pay | Admitting: *Deleted

## 2010-11-03 LAB — BASIC METABOLIC PANEL
BUN: 8 mg/dL (ref 6–23)
CO2: 24 mEq/L (ref 19–32)
Calcium: 8.9 mg/dL (ref 8.4–10.5)
Creatinine, Ser: 0.94 mg/dL (ref 0.4–1.2)
GFR calc non Af Amer: 57 mL/min — ABNORMAL LOW (ref >60)
Glucose, Bld: 148 mg/dL — ABNORMAL HIGH (ref 70–99)

## 2010-11-03 LAB — CBC
HCT: 39.2 % (ref 36.0–46.0)
Hemoglobin: 12.6 g/dL (ref 12.0–15.0)
MCH: 26.4 pg (ref 26.0–34.0)
MCHC: 32.1 g/dL (ref 30.0–36.0)
RDW: 15.5 % (ref 11.5–15.5)

## 2010-11-03 LAB — GLUCOSE, CAPILLARY: Glucose-Capillary: 152 mg/dL — ABNORMAL HIGH (ref 70–99)

## 2010-11-04 LAB — GLUCOSE, CAPILLARY

## 2010-11-04 LAB — CULTURE, BLOOD (ROUTINE X 2)
Culture  Setup Time: 201202090849
Culture: NO GROWTH

## 2010-11-05 NOTE — Consult Note (Signed)
NAMEJULENE, RAHN               ACCOUNT NO.:  [id number]  MEDICAL RECORD NO.:  [id number]           PATIENT TYPE:  I  LOCATION:  2316                         FACILITY:  [facility]  PHYSICIAN:  Doylene Canning. Ladona Ridgel, MD    DATE OF BIRTH:  [birth year]  DATE OF CONSULTATION:  10/29/2010 DATE OF DISCHARGE:                                CONSULTATION   Consultation is requested by the Pulmonary Critical Care Medicine staff.  INDICATION FOR CONSULTATION:  Evaluation of elevated cardiac enzymes and chronic congestive heart failure in the setting of bradycardia.  HISTORY OF PRESENT ILLNESS:  The patient is a 77 year old woman with dementia and a multitude of medical problems.  She has severe peripheral vascular disease and is status post bilateral BKA.  She has a history of diabetes.  She has a history of chronic urinary tract infections, chronic renal insufficiency, and congestive heart failure.  She has had an EF of 25% for over 2 years.  The patient was in her usual state of health when she was found on the floor by her daughter.  There were agonal respirations.  911 was called and when the paramedics arrived she was bradycardic with heart rates of 30 in the setting of agonal aspirations.  She was intubated and immediately heart rate improved as did her blood pressure.  She has undergone IV diuresis.  Her cardiac enzymes are elevated with troponin of 2 and mildly elevated CK and MB bands.  Initial attempts at intubation were unsuccessful.  The patient had been noted to be hypoglycemic in the hospital and has been treated with IV glucose.  Additional recent history is not provided as there are no family member and the patient is intubated.  Her past medical history is notable for nonischemic cardiomyopathy, I do not have cath data that I can find in the chart.  She has a history of pneumonia.  She has a history of syncope in the past.  She has a history of diabetes, history of hypothyroidism,  and a history of longstanding dementia.  FAMILY HISTORY:  Negative for premature coronary artery disease.  SOCIAL HISTORY:  The patient lives alone.  Her daughter lives near her. She had a history of tobacco abuse.  No alcohol abuse.  REVIEW OF SYSTEMS:  Could not be obtained secondary to the patient being intubated.  PHYSICAL EXAMINATION:  GENERAL:  She is a pleasant elderly-appearing woman in no acute distress.  She is intubated.  She is sedated.  She does respond to deep painful stimuli with withdrawal. VITAL SIGNS:  Blood pressure was 100/54, the pulse was 73 and regular, respirations were 16, temperature 98. HEENT:  Normocephalic and atraumatic.  Pupils equal and round. Oropharynx moist.  Sclerae anicteric. NECK:  No jugular venous distention, no thyromegaly.  Trachea midline. Carotids are 2+ and symmetric. LUNGS:  Clear bilaterally to auscultation.  No wheezes, rales, or rhonchi are present.  No increased work of breathing.  EKG demonstrates sinus rhythm with nonspecific lateral T-wave abnormality and first-degree AV block.  IMPRESSION: 1. Acute respiratory failure likely secondary to acute on chronic     congestive  heart failure. 2. Non-ST-elevation myocardial infarction/elevated troponin, unclear     whether this is primary or secondary event. 3. Peripheral vascular disease. 4. Chronic congestive heart failure with severe left ventricular     dysfunction.  DISCUSSION:  I have recommend the patient continue to undergo IV diuresis.  We will continue ventilatory support and attempt to wean her once her mental status allows.  At this point, a 2-D echo has been performed and we will follow that up.  I will plan on following the patient with you.  At this point, I would likely recommend a conservative approach with regard to workup of her elevated cardiac troponins.  Her bradycardia has resolved once she was extubated in the field.     Doylene Canning. Ladona Ridgel,  MD     GWT/MEDQ  D:  10/29/2010  T:  10/30/2010  Job:  [id number]  Electronically Signed by Lewayne Bunting MD on 11/05/2010 05:45:53 PM

## 2010-11-11 NOTE — Letter (Signed)
Summary: Generic Letter  Architectural technologist, Main Office  [address]   [place], [place] [address]   Phone: [phone]  Fax: [phone]        November 03, 2010 MRN: [id number]    [facility] [address] [place], [place]  [address]   Dear Gin[employer],       Mrs Arceneaux is followed in our cardiovascular clinic. She has an appointment to see [place] on Friday December 04, 2010 and will need transportation to that appointment. Her daughter will also need to ride the [place] with her to that appointment. Please call with any questions or concerns.   Sincerely,  Deliah Goody, RN/Dr Olga Millers

## 2010-11-11 NOTE — Progress Notes (Signed)
Summary: pt needs note sent to [facility] Note Call from Patient Call back at [employer] [phone]   Caller: Daughter/[name] Reason for Call: Talk to Nurse, Talk to Doctor Summary of Call: pt rides with transportation and they have new rules/ They need [place] to fax a note stating pt has an appt on 3/16 to see Dr. Jens Som and also to say her daughter needs to ride with her on the [place] to the appt please fax info to [phone] that is [facility] transportation Initial call taken by: Omer Jack,  November 03, 2010 3:24 PM  Follow-up for Phone Call        spoke with dtr, note faxed to the number provided Deliah Goody, RN  November 03, 2010 3:53 PM

## 2010-11-11 NOTE — Discharge Summary (Signed)
[name], [name]               ACCOUNT NO.:  [id number]  MEDICAL RECORD NO.:  [id number]           PATIENT TYPE:  I  LOCATION:  2011                         FACILITY:  [facility]  PHYSICIAN:  Tarry Kos, MD       DATE OF BIRTH:  11-26-1928  DATE OF ADMISSION:  10/28/2010 DATE OF DISCHARGE:  11/03/2010                              DISCHARGE SUMMARY   DISCHARGE DIAGNOSES: 1. Acute respiratory failure requiring intubation. 2. Aspiration pneumonitis. 3. Acute on chronic systolic heart failure with an ejection fraction     of 20%. 4. Non-ST elevation myocardial infarction. 5. Acute renal failure, resolved. 6. Dementia. 7. Status post history of a bilateral amputations, lower extremity.  SUMMARY OF HOSPITAL COURSE:  [name] is a 77 year old female presented to the emergency room with acute respiratory failure, acute congestive heart failure required intubation for brief period of time for approximately 24 hours before she was extubated.  It was felt she had aspiration pneumonitis.  She was covered with Flagyl and Rocephin and actually received 7 days of that; however, she resultedly had a non- STEMI and it was thought that her respiratory failure was most likely due more to acute exacerbation of her heart failure.  She was also in renal failure.  She was extubated, she improved with Lasix.  She was medically managed for her cardiac issues.  She has had dramatic improvement over the last several days and she has returned back to her baseline.  She is bed bound.  She has had a history of bilateral amputations of her lower extremities.  She has been afebrile for over 48 hours.  PHYSICAL EXAMINATION:  VITAL SIGNS:  Stable. GENERAL:  Alert, no apparent distress, pleasantly demented. CARDIOVASCULAR:  Regular rate and rhythm without murmurs or gallops. CHEST:  Clear to auscultation bilaterally.  No wheezes, rhonchi, or rales. ABDOMEN:  Soft, nontender, nondistended.  Positive  bowel sounds noted. No hepatosplenomegaly. EXTREMITIES:  No clubbing, cyanosis, edema. PSYCHIATRIC:  Normal affect. NEUROLOGIC:  Cranial II through XII grossly intact.  No focal neurologic deficits.  The patient lives with her daughter who takes care of her at home.  We have arranged home health, physical therapy, maximum amount of help at her home.  I am going to discharge her home to follow up with her primary care physician in 1 week and a cardiologist at [facility] in 2 to 4 weeks.  I am going to switch her over to complete 5 more day of Levaquin.  I would minimize sedatives.  Her daughter did not want her to have a sleeping pill; however, due to her recent events, I would hold off on any sedatives at this point and have her follow up with her primary care physician in approximately a week.  Other pertinent labs during this hospitalizations, 1. Her cultures were negative. 2. Her troponin peaked at 10.2, CK-MB 34.  Again blood culture is     negative.  CBC has been normal.  Her creatinine was mildly elevated     and has returned back to her normal, today at 0.9 creatinine.  CT  of her head on admission did not show anything acute.  Her 2-D echo     done October 29, 2010, showed EF of 20%, diffuse hypokinesis.     Please see report for full details.  Again, it was recommend by     Cardiology for medical management due to her multiple comorbidity.     The patient is being discharged home in care of her daughter.  I     did offer short-term rehab or skilled nursing facility placement;     however, the patient did not wish to do so.  Again, we have     arranged maximum amount of help at home that we can get through     home health agency.  DISCHARGE MEDICATIONS: 1. Lasix 20 mg daily #30 given, no refills. 2. Ensure t.i.d. 3. Lantus 20 units subcu at bedtime. 4. Amlodipine 5 mg daily. 5. Levothyroxine 75 mcg daily. 6. Amaryl 4 mg daily. 7. Aspirin 81 mg daily.. 8.  Carvedilol 3.125 mg twice a day. 9. Mucinex over the counter as needed. 10.Levaquin 500 mg daily for 5 days, will complete a 2-week course of     antibiotics for aspiration pneumonitis.          ______________________________ Tarry Kos, MD     RD/MEDQ  D:  11/03/2010  T:  11/04/2010  Job:  [id number]  Electronically Signed by Eldridge Dace MD on 11/11/2010 01:38:54 PM

## 2010-12-01 LAB — GLUCOSE, CAPILLARY
Glucose-Capillary: 125 mg/dL — ABNORMAL HIGH (ref 70–99)
Glucose-Capillary: 135 mg/dL — ABNORMAL HIGH (ref 70–99)
Glucose-Capillary: 141 mg/dL — ABNORMAL HIGH (ref 70–99)
Glucose-Capillary: 143 mg/dL — ABNORMAL HIGH (ref 70–99)
Glucose-Capillary: 173 mg/dL — ABNORMAL HIGH (ref 70–99)
Glucose-Capillary: 189 mg/dL — ABNORMAL HIGH (ref 70–99)
Glucose-Capillary: 236 mg/dL — ABNORMAL HIGH (ref 70–99)
Glucose-Capillary: 238 mg/dL — ABNORMAL HIGH (ref 70–99)
Glucose-Capillary: 239 mg/dL — ABNORMAL HIGH (ref 70–99)
Glucose-Capillary: 252 mg/dL — ABNORMAL HIGH (ref 70–99)
Glucose-Capillary: 255 mg/dL — ABNORMAL HIGH (ref 70–99)
Glucose-Capillary: 319 mg/dL — ABNORMAL HIGH (ref 70–99)
Glucose-Capillary: 321 mg/dL — ABNORMAL HIGH (ref 70–99)
Glucose-Capillary: 330 mg/dL — ABNORMAL HIGH (ref 70–99)
Glucose-Capillary: 56 mg/dL — ABNORMAL LOW (ref 70–99)
Glucose-Capillary: 96 mg/dL (ref 70–99)
Glucose-Capillary: 224 mg/dL — ABNORMAL HIGH (ref 70–99)

## 2010-12-01 LAB — PROTEIN ELECTROPHORESIS, SERUM
Albumin ELP: 32.2 % — ABNORMAL LOW (ref 55.8–66.1)
Alpha-1-Globulin: 11.5 % — ABNORMAL HIGH (ref 2.9–4.9)
Gamma Globulin: 21.8 % — ABNORMAL HIGH (ref 11.1–18.8)
Total Protein ELP: 5.8 g/dL — ABNORMAL LOW (ref 6.0–8.3)

## 2010-12-01 LAB — CULTURE, BLOOD (ROUTINE X 2): Culture: NO GROWTH

## 2010-12-01 LAB — RENAL FUNCTION PANEL
CO2: 20 mEq/L (ref 19–32)
CO2: 21 mEq/L (ref 19–32)
Calcium: 8.3 mg/dL — ABNORMAL LOW (ref 8.4–10.5)
Chloride: 106 mEq/L (ref 96–112)
GFR calc Af Amer: 33 mL/min — ABNORMAL LOW (ref >60)
GFR calc non Af Amer: 27 mL/min — ABNORMAL LOW (ref >60)
Glucose, Bld: 70 mg/dL (ref 70–99)
Phosphorus: 3.8 mg/dL (ref 2.3–4.6)
Phosphorus: 4.1 mg/dL (ref 2.3–4.6)
Potassium: 4.6 mEq/L (ref 3.5–5.1)
Sodium: 135 mEq/L (ref 135–145)
Sodium: 137 mEq/L (ref 135–145)

## 2010-12-01 LAB — CBC
HCT: 25.2 % — ABNORMAL LOW (ref 36.0–46.0)
HCT: 28.2 % — ABNORMAL LOW (ref 36.0–46.0)
HCT: 28.9 % — ABNORMAL LOW (ref 36.0–46.0)
Hemoglobin: 10.2 g/dL — ABNORMAL LOW (ref 12.0–15.0)
Hemoglobin: 9.5 g/dL — ABNORMAL LOW (ref 12.0–15.0)
MCH: 30.3 pg (ref 26.0–34.0)
MCH: 30.4 pg (ref 26.0–34.0)
MCH: 30.4 pg (ref 26.0–34.0)
MCH: 31.1 pg (ref 26.0–34.0)
MCHC: 33.4 g/dL (ref 30.0–36.0)
MCHC: 34 g/dL (ref 30.0–36.0)
MCHC: 34.1 g/dL (ref 30.0–36.0)
MCHC: 34.3 g/dL (ref 30.0–36.0)
MCHC: 34.7 g/dL (ref 30.0–36.0)
MCV: 89.5 fL (ref 78.0–100.0)
MCV: 89.8 fL (ref 78.0–100.0)
MCV: 91 fL (ref 78.0–100.0)
Platelets: 331 10*3/uL (ref 150–400)
Platelets: 349 10*3/uL (ref 150–400)
Platelets: 360 10*3/uL (ref 150–400)
RBC: 2.77 MIL/uL — ABNORMAL LOW (ref 3.87–5.11)
RBC: 3.12 MIL/uL — ABNORMAL LOW (ref 3.87–5.11)
RBC: 3.22 MIL/uL — ABNORMAL LOW (ref 3.87–5.11)
RBC: 3.34 MIL/uL — ABNORMAL LOW (ref 3.87–5.11)
RDW: 13.8 % (ref 11.5–15.5)
RDW: 13.9 % (ref 11.5–15.5)
RDW: 14.5 % (ref 11.5–15.5)
RDW: 14.6 % (ref 11.5–15.5)
WBC: 14.8 10*3/uL — ABNORMAL HIGH (ref 4.0–10.5)
WBC: 15.3 10*3/uL — ABNORMAL HIGH (ref 4.0–10.5)
WBC: 22.4 10*3/uL — ABNORMAL HIGH (ref 4.0–10.5)
WBC: 31.2 10*3/uL — ABNORMAL HIGH (ref 4.0–10.5)

## 2010-12-01 LAB — WOUND CULTURE: Gram Stain: NONE SEEN

## 2010-12-01 LAB — BASIC METABOLIC PANEL
BUN: 25 mg/dL — ABNORMAL HIGH (ref 6–23)
BUN: 31 mg/dL — ABNORMAL HIGH (ref 6–23)
BUN: 31 mg/dL — ABNORMAL HIGH (ref 6–23)
BUN: 35 mg/dL — ABNORMAL HIGH (ref 6–23)
BUN: 43 mg/dL — ABNORMAL HIGH (ref 6–23)
CO2: 20 mEq/L (ref 19–32)
CO2: 21 mEq/L (ref 19–32)
Calcium: 8.5 mg/dL (ref 8.4–10.5)
Chloride: 104 mEq/L (ref 96–112)
Chloride: 110 mEq/L (ref 96–112)
Creatinine, Ser: 1.13 mg/dL (ref 0.4–1.2)
Creatinine, Ser: 1.26 mg/dL — ABNORMAL HIGH (ref 0.4–1.2)
GFR calc Af Amer: 40 mL/min — ABNORMAL LOW (ref >60)
GFR calc Af Amer: 56 mL/min — ABNORMAL LOW (ref >60)
GFR calc non Af Amer: 21 mL/min — ABNORMAL LOW (ref >60)
GFR calc non Af Amer: 33 mL/min — ABNORMAL LOW (ref >60)
GFR calc non Af Amer: 38 mL/min — ABNORMAL LOW (ref >60)
GFR calc non Af Amer: 41 mL/min — ABNORMAL LOW (ref >60)
Glucose, Bld: 202 mg/dL — ABNORMAL HIGH (ref 70–99)
Glucose, Bld: 275 mg/dL — ABNORMAL HIGH (ref 70–99)
Potassium: 4.3 mEq/L (ref 3.5–5.1)
Potassium: 4.3 mEq/L (ref 3.5–5.1)
Potassium: 4.9 mEq/L (ref 3.5–5.1)
Potassium: 5.8 mEq/L — ABNORMAL HIGH (ref 3.5–5.1)
Sodium: 130 mEq/L — ABNORMAL LOW (ref 135–145)
Sodium: 135 mEq/L (ref 135–145)
Sodium: 136 mEq/L (ref 135–145)

## 2010-12-01 LAB — COMPREHENSIVE METABOLIC PANEL
ALT: 32 U/L (ref 0–35)
AST: 34 U/L (ref 0–37)
Alkaline Phosphatase: 169 U/L — ABNORMAL HIGH (ref 39–117)
CO2: 25 mEq/L (ref 19–32)
Calcium: 9.2 mg/dL (ref 8.4–10.5)
Chloride: 94 mEq/L — ABNORMAL LOW (ref 96–112)
GFR calc Af Amer: 38 mL/min — ABNORMAL LOW (ref >60)
GFR calc non Af Amer: 32 mL/min — ABNORMAL LOW (ref >60)
Potassium: 3.8 mEq/L (ref 3.5–5.1)
Sodium: 130 mEq/L — ABNORMAL LOW (ref 135–145)

## 2010-12-01 LAB — URINALYSIS, MICROSCOPIC ONLY
Glucose, UA: >1000 mg/dL — AB
Leukocytes, UA: NEGATIVE
Nitrite: NEGATIVE
Specific Gravity, Urine: 1.016 (ref 1.005–1.030)
pH: 6 (ref 5.0–8.0)

## 2010-12-01 LAB — DIFFERENTIAL
Basophils Relative: 0 % (ref 0–1)
Basophils Relative: 0 % (ref 0–1)
Eosinophils Absolute: 0 10*3/uL (ref 0.0–0.7)
Eosinophils Absolute: 0 10*3/uL (ref 0.0–0.7)
Eosinophils Relative: 0 % (ref 0–5)
Lymphocytes Relative: 10 % — ABNORMAL LOW (ref 12–46)
Lymphocytes Relative: 7 % — ABNORMAL LOW (ref 12–46)
Lymphs Abs: 2.1 10*3/uL (ref 0.7–4.0)
Lymphs Abs: 2.3 10*3/uL (ref 0.7–4.0)
Monocytes Absolute: 1.7 10*3/uL — ABNORMAL HIGH (ref 0.1–1.0)
Monocytes Relative: 4 % (ref 3–12)
Monocytes Relative: 7 % (ref 3–12)
Monocytes Relative: 8 % (ref 3–12)
Neutro Abs: 12.1 10*3/uL — ABNORMAL HIGH (ref 1.7–7.7)
Neutro Abs: 27.8 10*3/uL — ABNORMAL HIGH (ref 1.7–7.7)
Neutrophils Relative %: 78 % — ABNORMAL HIGH (ref 43–77)

## 2010-12-01 LAB — URINE MICROSCOPIC-ADD ON

## 2010-12-01 LAB — URINALYSIS, ROUTINE W REFLEX MICROSCOPIC
Bilirubin Urine: NEGATIVE
Nitrite: NEGATIVE
Nitrite: NEGATIVE
Protein, ur: NEGATIVE mg/dL
Specific Gravity, Urine: 1.013 (ref 1.005–1.030)
Specific Gravity, Urine: 1.016 (ref 1.005–1.030)
Urobilinogen, UA: 1 mg/dL (ref 0.0–1.0)
Urobilinogen, UA: 1 mg/dL (ref 0.0–1.0)

## 2010-12-01 LAB — PROTIME-INR: INR: 1.19 (ref 0.00–1.49)

## 2010-12-01 LAB — UIFE/LIGHT CHAINS/TP QN, 24-HR UR
Albumin, U: DETECTED
Alpha 1, Urine: DETECTED — AB
Free Lambda Lt Chains,Ur: 11.3 mg/dL — ABNORMAL HIGH (ref 0.08–1.01)
Total Protein, Urine: 61.1 mg/dL

## 2010-12-01 LAB — APTT: aPTT: 41 seconds — ABNORMAL HIGH (ref 24–37)

## 2010-12-01 LAB — URINE CULTURE: Colony Count: >=100000

## 2010-12-01 LAB — VANCOMYCIN, RANDOM: Vancomycin Rm: 13.3 ug/mL

## 2010-12-01 LAB — TYPE AND SCREEN: Antibody Screen: NEGATIVE

## 2010-12-01 LAB — HEMOGLOBIN A1C: Hgb A1c MFr Bld: 12.3 % — ABNORMAL HIGH (ref <5.7)

## 2010-12-01 LAB — TSH: TSH: 3.511 u[IU]/mL (ref 0.350–4.500)

## 2010-12-04 ENCOUNTER — Encounter: Payer: Medicare Other | Admitting: Cardiology

## 2010-12-09 LAB — CBC
HCT: 27.3 % — ABNORMAL LOW (ref 36.0–46.0)
Hemoglobin: 9.6 g/dL — ABNORMAL LOW (ref 12.0–15.0)
Hemoglobin: 9.7 g/dL — ABNORMAL LOW (ref 12.0–15.0)
MCHC: 33.2 g/dL (ref 30.0–36.0)
MCV: 86.1 fL (ref 78.0–100.0)
MCV: 86.8 fL (ref 78.0–100.0)
RBC: 3.38 MIL/uL — ABNORMAL LOW (ref 3.87–5.11)
RBC: 3.15 MIL/uL — ABNORMAL LOW (ref 3.87–5.11)
RDW: 15.7 % — ABNORMAL HIGH (ref 11.5–15.5)
RDW: 15.2 % (ref 11.5–15.5)
WBC: 12.7 10*3/uL — ABNORMAL HIGH (ref 4.0–10.5)

## 2010-12-09 LAB — BASIC METABOLIC PANEL
CO2: 27 mEq/L (ref 19–32)
Calcium: 9.1 mg/dL (ref 8.4–10.5)
Chloride: 105 mEq/L (ref 96–112)
Chloride: 112 mEq/L (ref 96–112)
Creatinine, Ser: 1.24 mg/dL — ABNORMAL HIGH (ref 0.4–1.2)
GFR calc Af Amer: 51 mL/min — ABNORMAL LOW (ref >60)
GFR calc Af Amer: >60 mL/min (ref >60)
GFR calc Af Amer: >60 mL/min (ref >60)
GFR calc non Af Amer: >60 mL/min (ref >60)
Glucose, Bld: 625 mg/dL (ref 70–99)
Glucose, Bld: 229 mg/dL — ABNORMAL HIGH (ref 70–99)
Potassium: 3.9 mEq/L (ref 3.5–5.1)
Sodium: 139 mEq/L (ref 135–145)
Sodium: 146 mEq/L — ABNORMAL HIGH (ref 135–145)
Sodium: 147 mEq/L — ABNORMAL HIGH (ref 135–145)

## 2010-12-09 LAB — HEMOGLOBIN A1C
Hgb A1c MFr Bld: 12.9 % — ABNORMAL HIGH (ref 4.6–6.1)
Hgb A1c MFr Bld: 13.4 % — ABNORMAL HIGH (ref 4.6–6.1)
Mean Plasma Glucose: 324 mg/dL
Mean Plasma Glucose: 338 mg/dL

## 2010-12-09 LAB — URINALYSIS, ROUTINE W REFLEX MICROSCOPIC
Bilirubin Urine: NEGATIVE
Glucose, UA: >1000 mg/dL — AB
Ketones, ur: NEGATIVE mg/dL
Nitrite: NEGATIVE
Specific Gravity, Urine: 1.031 — ABNORMAL HIGH (ref 1.005–1.030)
pH: 5.5 (ref 5.0–8.0)

## 2010-12-09 LAB — LACTIC ACID, PLASMA: Lactic Acid, Venous: 2.8 mmol/L — ABNORMAL HIGH (ref 0.5–2.2)

## 2010-12-09 LAB — GLUCOSE, CAPILLARY
Glucose-Capillary: 313 mg/dL — ABNORMAL HIGH (ref 70–99)
Glucose-Capillary: 352 mg/dL — ABNORMAL HIGH (ref 70–99)
Glucose-Capillary: 121 mg/dL — ABNORMAL HIGH (ref 70–99)
Glucose-Capillary: 124 mg/dL — ABNORMAL HIGH (ref 70–99)
Glucose-Capillary: 145 mg/dL — ABNORMAL HIGH (ref 70–99)
Glucose-Capillary: 157 mg/dL — ABNORMAL HIGH (ref 70–99)
Glucose-Capillary: 162 mg/dL — ABNORMAL HIGH (ref 70–99)
Glucose-Capillary: 170 mg/dL — ABNORMAL HIGH (ref 70–99)
Glucose-Capillary: 286 mg/dL — ABNORMAL HIGH (ref 70–99)
Glucose-Capillary: 67 mg/dL — ABNORMAL LOW (ref 70–99)
Glucose-Capillary: 88 mg/dL (ref 70–99)

## 2010-12-09 LAB — RETICULOCYTES
RBC.: 3.28 MIL/uL — ABNORMAL LOW (ref 3.87–5.11)
Retic Ct Pct: 1.4 % (ref 0.4–3.1)

## 2010-12-09 LAB — CK TOTAL AND CKMB (NOT AT ARMC)
CK, MB: 1.1 ng/mL (ref 0.3–4.0)
Relative Index: INVALID (ref 0.0–2.5)
Total CK: 37 U/L (ref 7–177)

## 2010-12-09 LAB — URINE CULTURE: Culture: NO GROWTH

## 2010-12-09 LAB — HEPATIC FUNCTION PANEL
Bilirubin, Direct: 0.2 mg/dL (ref 0.0–0.3)
Indirect Bilirubin: 0.2 mg/dL — ABNORMAL LOW (ref 0.3–0.9)
Total Bilirubin: 0.4 mg/dL (ref 0.3–1.2)

## 2010-12-09 LAB — IRON AND TIBC
Iron: 11 ug/dL — ABNORMAL LOW (ref 42–135)
Saturation Ratios: 14 % — ABNORMAL LOW (ref 20–55)
UIBC: 68 ug/dL

## 2010-12-09 LAB — COMPREHENSIVE METABOLIC PANEL
AST: 22 U/L (ref 0–37)
Albumin: 1.6 g/dL — ABNORMAL LOW (ref 3.5–5.2)
Alkaline Phosphatase: 90 U/L (ref 39–117)
BUN: 24 mg/dL — ABNORMAL HIGH (ref 6–23)
Chloride: 106 mEq/L (ref 96–112)
Potassium: 4.2 mEq/L (ref 3.5–5.1)
Total Bilirubin: 0.4 mg/dL (ref 0.3–1.2)

## 2010-12-09 LAB — CARDIAC PANEL(CRET KIN+CKTOT+MB+TROPI)
CK, MB: 3.7 ng/mL (ref 0.3–4.0)
Total CK: 1100 U/L — ABNORMAL HIGH (ref 7–177)
Total CK: 386 U/L — ABNORMAL HIGH (ref 7–177)
Troponin I: 0.07 ng/mL — ABNORMAL HIGH (ref 0.00–0.06)
Troponin I: 0.1 ng/mL — ABNORMAL HIGH (ref 0.00–0.06)

## 2010-12-09 LAB — DIFFERENTIAL
Basophils Relative: 0 % (ref 0–1)
Eosinophils Absolute: 0 10*3/uL (ref 0.0–0.7)
Monocytes Absolute: 0.9 10*3/uL (ref 0.1–1.0)
Monocytes Relative: 5 % (ref 3–12)

## 2010-12-09 LAB — TYPE AND SCREEN: ABO/RH(D): O POS

## 2010-12-09 LAB — CULTURE, BLOOD (ROUTINE X 2)

## 2010-12-09 LAB — FERRITIN: Ferritin: 413 ng/mL — ABNORMAL HIGH (ref 10–291)

## 2010-12-09 LAB — URINE MICROSCOPIC-ADD ON

## 2010-12-09 LAB — TSH: TSH: 1.631 u[IU]/mL (ref 0.350–4.500)

## 2010-12-09 LAB — VITAMIN B12: Vitamin B-12: 1092 pg/mL — ABNORMAL HIGH (ref 211–911)

## 2010-12-12 ENCOUNTER — Encounter: Payer: Self-pay | Admitting: Physician Assistant

## 2010-12-17 ENCOUNTER — Telehealth: Payer: Self-pay | Admitting: Cardiology

## 2010-12-17 NOTE — Telephone Encounter (Signed)
Spoke with pt dtr, generic letter from feb that was in EMR faxed to number provided.

## 2010-12-18 ENCOUNTER — Encounter: Payer: Self-pay | Admitting: Cardiology

## 2010-12-21 ENCOUNTER — Encounter: Payer: Medicare Other | Admitting: Physician Assistant

## 2010-12-24 LAB — COMPREHENSIVE METABOLIC PANEL
AST: 21 U/L (ref 0–37)
Albumin: 3 g/dL — ABNORMAL LOW (ref 3.5–5.2)
BUN: 7 mg/dL (ref 6–23)
Creatinine, Ser: 0.69 mg/dL (ref 0.4–1.2)
GFR calc Af Amer: >60 mL/min (ref >60)
Total Protein: 7.5 g/dL (ref 6.0–8.3)

## 2010-12-24 LAB — BASIC METABOLIC PANEL
BUN: 13 mg/dL (ref 6–23)
BUN: 13 mg/dL (ref 6–23)
BUN: 6 mg/dL (ref 6–23)
CO2: 32 mEq/L (ref 19–32)
CO2: 33 mEq/L — ABNORMAL HIGH (ref 19–32)
Calcium: 8.1 mg/dL — ABNORMAL LOW (ref 8.4–10.5)
Calcium: 8.5 mg/dL (ref 8.4–10.5)
Calcium: 8.5 mg/dL (ref 8.4–10.5)
Calcium: 8.6 mg/dL (ref 8.4–10.5)
Calcium: 8.9 mg/dL (ref 8.4–10.5)
Calcium: 8.9 mg/dL (ref 8.4–10.5)
Calcium: 9.1 mg/dL (ref 8.4–10.5)
Chloride: 100 mEq/L (ref 96–112)
Creatinine, Ser: 0.96 mg/dL (ref 0.4–1.2)
Creatinine, Ser: 1.04 mg/dL (ref 0.4–1.2)
Creatinine, Ser: 1.09 mg/dL (ref 0.4–1.2)
Creatinine, Ser: 1.17 mg/dL (ref 0.4–1.2)
GFR calc Af Amer: 54 mL/min — ABNORMAL LOW (ref >60)
GFR calc Af Amer: 59 mL/min — ABNORMAL LOW (ref >60)
GFR calc Af Amer: >60 mL/min (ref >60)
GFR calc Af Amer: >60 mL/min (ref >60)
GFR calc Af Amer: >60 mL/min (ref >60)
GFR calc Af Amer: >60 mL/min (ref >60)
GFR calc non Af Amer: 45 mL/min — ABNORMAL LOW (ref >60)
GFR calc non Af Amer: 49 mL/min — ABNORMAL LOW (ref >60)
GFR calc non Af Amer: 50 mL/min — ABNORMAL LOW (ref >60)
GFR calc non Af Amer: 51 mL/min — ABNORMAL LOW (ref >60)
GFR calc non Af Amer: 56 mL/min — ABNORMAL LOW (ref >60)
GFR calc non Af Amer: >60 mL/min (ref >60)
GFR calc non Af Amer: >60 mL/min (ref >60)
GFR calc non Af Amer: >60 mL/min (ref >60)
Glucose, Bld: 113 mg/dL — ABNORMAL HIGH (ref 70–99)
Glucose, Bld: 163 mg/dL — ABNORMAL HIGH (ref 70–99)
Glucose, Bld: 209 mg/dL — ABNORMAL HIGH (ref 70–99)
Potassium: 3.6 mEq/L (ref 3.5–5.1)
Potassium: 3.7 mEq/L (ref 3.5–5.1)
Potassium: 3.7 mEq/L (ref 3.5–5.1)
Potassium: 4.6 mEq/L (ref 3.5–5.1)
Sodium: 135 mEq/L (ref 135–145)
Sodium: 137 mEq/L (ref 135–145)
Sodium: 139 mEq/L (ref 135–145)
Sodium: 135 mEq/L (ref 135–145)
Sodium: 135 mEq/L (ref 135–145)
Sodium: 136 mEq/L (ref 135–145)

## 2010-12-24 LAB — POCT I-STAT, CHEM 8
HCT: 38 % (ref 36.0–46.0)
Hemoglobin: 12.9 g/dL (ref 12.0–15.0)
Potassium: 3 mEq/L — ABNORMAL LOW (ref 3.5–5.1)
Sodium: 136 mEq/L (ref 135–145)

## 2010-12-24 LAB — CBC
HCT: 34.2 % — ABNORMAL LOW (ref 36.0–46.0)
HCT: 35.3 % — ABNORMAL LOW (ref 36.0–46.0)
HCT: 35.6 % — ABNORMAL LOW (ref 36.0–46.0)
HCT: 39.8 % (ref 36.0–46.0)
Hemoglobin: 12.2 g/dL (ref 12.0–15.0)
Hemoglobin: 11.7 g/dL — ABNORMAL LOW (ref 12.0–15.0)
Hemoglobin: 11.9 g/dL — ABNORMAL LOW (ref 12.0–15.0)
Hemoglobin: 12 g/dL (ref 12.0–15.0)
MCHC: 33.4 g/dL (ref 30.0–36.0)
MCV: 89.4 fL (ref 78.0–100.0)
MCV: 89.4 fL (ref 78.0–100.0)
Platelets: 272 10*3/uL (ref 150–400)
Platelets: 294 10*3/uL (ref 150–400)
RBC: 4.13 MIL/uL (ref 3.87–5.11)
RBC: 3.95 MIL/uL (ref 3.87–5.11)
RBC: 3.96 MIL/uL (ref 3.87–5.11)
RDW: 13.8 % (ref 11.5–15.5)
RDW: 13.8 % (ref 11.5–15.5)
RDW: 14.1 % (ref 11.5–15.5)
WBC: 10.2 10*3/uL (ref 4.0–10.5)
WBC: 10.7 10*3/uL — ABNORMAL HIGH (ref 4.0–10.5)
WBC: 10.8 10*3/uL — ABNORMAL HIGH (ref 4.0–10.5)
WBC: 10.9 10*3/uL — ABNORMAL HIGH (ref 4.0–10.5)

## 2010-12-24 LAB — MAGNESIUM: Magnesium: 1.7 mg/dL (ref 1.5–2.5)

## 2010-12-24 LAB — GLUCOSE, CAPILLARY
Glucose-Capillary: 111 mg/dL — ABNORMAL HIGH (ref 70–99)
Glucose-Capillary: 111 mg/dL — ABNORMAL HIGH (ref 70–99)
Glucose-Capillary: 117 mg/dL — ABNORMAL HIGH (ref 70–99)
Glucose-Capillary: 145 mg/dL — ABNORMAL HIGH (ref 70–99)
Glucose-Capillary: 150 mg/dL — ABNORMAL HIGH (ref 70–99)
Glucose-Capillary: 153 mg/dL — ABNORMAL HIGH (ref 70–99)
Glucose-Capillary: 159 mg/dL — ABNORMAL HIGH (ref 70–99)
Glucose-Capillary: 203 mg/dL — ABNORMAL HIGH (ref 70–99)
Glucose-Capillary: 31 mg/dL — CL (ref 70–99)
Glucose-Capillary: 55 mg/dL — ABNORMAL LOW (ref 70–99)
Glucose-Capillary: 67 mg/dL — ABNORMAL LOW (ref 70–99)
Glucose-Capillary: 91 mg/dL (ref 70–99)
Glucose-Capillary: 97 mg/dL (ref 70–99)
Glucose-Capillary: 131 mg/dL — ABNORMAL HIGH (ref 70–99)
Glucose-Capillary: 142 mg/dL — ABNORMAL HIGH (ref 70–99)
Glucose-Capillary: 185 mg/dL — ABNORMAL HIGH (ref 70–99)
Glucose-Capillary: 186 mg/dL — ABNORMAL HIGH (ref 70–99)
Glucose-Capillary: 220 mg/dL — ABNORMAL HIGH (ref 70–99)
Glucose-Capillary: 252 mg/dL — ABNORMAL HIGH (ref 70–99)
Glucose-Capillary: 280 mg/dL — ABNORMAL HIGH (ref 70–99)

## 2010-12-24 LAB — DIFFERENTIAL
Basophils Absolute: 0 10*3/uL (ref 0.0–0.1)
Basophils Relative: 0 % (ref 0–1)
Eosinophils Relative: 1 % (ref 0–5)
Lymphocytes Relative: 20 % (ref 12–46)
Lymphocytes Relative: 19 % (ref 12–46)
Lymphs Abs: 2 10*3/uL (ref 0.7–4.0)
Monocytes Absolute: 0.9 10*3/uL (ref 0.1–1.0)
Monocytes Absolute: 1.1 10*3/uL — ABNORMAL HIGH (ref 0.1–1.0)
Monocytes Relative: 8 % (ref 3–12)
Monocytes Relative: 11 % (ref 3–12)
Neutro Abs: 7.9 10*3/uL — ABNORMAL HIGH (ref 1.7–7.7)
Neutro Abs: 7.6 10*3/uL (ref 1.7–7.7)
Neutrophils Relative %: 70 % (ref 43–77)

## 2010-12-24 LAB — URINALYSIS, ROUTINE W REFLEX MICROSCOPIC
Bilirubin Urine: NEGATIVE
Glucose, UA: NEGATIVE mg/dL
Specific Gravity, Urine: 1.011 (ref 1.005–1.030)
pH: 7 (ref 5.0–8.0)

## 2010-12-24 LAB — TSH
TSH: 2.224 u[IU]/mL (ref 0.350–4.500)
TSH: 2.175 u[IU]/mL (ref 0.350–4.500)

## 2010-12-24 LAB — HEPATIC FUNCTION PANEL
AST: 20 U/L (ref 0–37)
Alkaline Phosphatase: 102 U/L (ref 39–117)
Bilirubin, Direct: 0.2 mg/dL (ref 0.0–0.3)
Total Bilirubin: 0.8 mg/dL (ref 0.3–1.2)

## 2010-12-24 LAB — TROPONIN I: Troponin I: 0.16 ng/mL — ABNORMAL HIGH (ref 0.00–0.06)

## 2010-12-24 LAB — POCT CARDIAC MARKERS
CKMB, poc: 2.3 ng/mL (ref 1.0–8.0)
Myoglobin, poc: 202 ng/mL (ref 12–200)

## 2010-12-24 LAB — URINE MICROSCOPIC-ADD ON

## 2010-12-24 LAB — CARDIAC PANEL(CRET KIN+CKTOT+MB+TROPI)
Relative Index: 0.8 (ref 0.0–2.5)
Relative Index: INVALID (ref 0.0–2.5)
Total CK: 58 U/L (ref 7–177)

## 2010-12-24 LAB — FOLATE RBC: RBC Folate: 763 ng/mL — ABNORMAL HIGH (ref 180–600)

## 2010-12-24 LAB — CULTURE, BLOOD (ROUTINE X 2): Culture: NO GROWTH

## 2010-12-24 LAB — CK TOTAL AND CKMB (NOT AT ARMC): Relative Index: INVALID (ref 0.0–2.5)

## 2010-12-24 LAB — C-REACTIVE PROTEIN: CRP: 14.9 mg/dL — ABNORMAL HIGH (ref <0.6)

## 2010-12-24 LAB — SEDIMENTATION RATE: Sed Rate: 95 mm/hr — ABNORMAL HIGH (ref 0–22)

## 2010-12-24 LAB — VITAMIN B12: Vitamin B-12: 836 pg/mL (ref 211–911)

## 2010-12-24 LAB — HEMOGLOBIN A1C: Mean Plasma Glucose: 235 mg/dL

## 2011-01-06 ENCOUNTER — Encounter: Payer: Self-pay | Admitting: Cardiology

## 2011-02-02 NOTE — Assessment & Plan Note (Signed)
OFFICE VISIT   IVELIS, NORGARD  DOB:  1928/11/12                                       05/29/2008  ZOXWR#:[id number]   The patient is a 77 year old female who has had diabetes for a lengthy  period of time and has had intermittent chronic exacerbations and  remissions of a wound on her right foot.  She is currently being  followed by Dr. Leanord Hawking in the wound care center.  She states that she  has had a wound on her right foot several times before and this has  healed spontaneously but then it recurs quickly after that.  The current  wound on her right foot has been present for approximately 3 months.  It  started out as a blister.  She has no pain in the ulcer itself.  She  does have some occasional pain in her right foot and leg.   Her atherosclerotic risk factors include diabetes, hypertension.  She  denies history of elevated cholesterol or coronary artery disease.   PAST SURGICAL HISTORY:  Is remarkable for a traumatic left eye removal.   PAST MEDICAL HISTORY:  Is as listed above as well as hypothyroidism.   MEDICATIONS:  1. Glimepiride 4 mg once a day.  2. Cipro 500 mg twice a day.  3. Tylenol with codeine p.r.n.  4. Levothyroxine 75 mcg once daily.  5. Calcium supplement b.i.d.  6. Amlodipine 5 mg once a day.  7. Novolin 70/30 insulin, 40 in the morning and 30 in the evening.   ALLERGIES:  She has no known drug allergies.   FAMILY HISTORY:  Unremarkable.   SOCIAL HISTORY:  She lives with her daughter.   REVIEW OF SYSTEMS:  PULMONARY:  She has some shortness of breath with  minimal exertion.  General, cardiac, GI, renal, vascular, neurologic, orthopedic,  psychiatric, ENT and hematologic review of systems are all otherwise  negative.   PHYSICAL EXAMINATION:  Vital signs:  Blood pressure is 178/77 in the  left arm, heart rate is 94 regular.  HEENT:  Unremarkable.  Neck:  Has  2+ carotid pulses with bilateral bruits.  Chest:  Clear to  auscultation.  Cardiac:  Regular rate and rhythm without murmur.  Abdomen:  Soft,  nontender, nondistended with no masses.  Extremities:  She has 2+  brachial and radial pulses bilaterally.  She has 2+ femoral pulses  bilaterally.  She has absent popliteal and pedal pulses bilaterally.  She has a Charcot shaped foot on the right side with degenerative joint  changes.  She has a 2 cm ulcer on the medial aspect of her right foot  with pink granulation tissue at the base.  There is no surrounding  erythema.  There is no purulent drainage.   She had bilateral ABIs performed today which were 0.4 on the right and  0.7 on the left.   I believe that she does not have sufficient arterial supply to her right  foot to heal this wound.  I believe the best option for her is  arteriogram and lower extremity runoff with possible angioplasty and  stenting of her right leg.  This is scheduled for next week.  We may  need to consider a bypass to improve perfusion.  Otherwise I think she  is at high risk for limb loss in the right leg.  Of note, she also had bilateral carotid bruits.  We attempted to try to  get a carotid duplex exam on her today.  She states that she has had one  in the past but not recently.  She did not wish to stay for the second  exam today and we will try to get this scheduled for her in the near  future.  She has been asymptomatic.   Janetta Hora. Fields, MD  Electronically Signed   CEF/MEDQ  D:  05/29/2008  T:  05/31/2008  Job:  [id number]   cc:   Fleet Contras, M.D.  [facility]

## 2011-02-02 NOTE — Assessment & Plan Note (Signed)
[facility]   NAME:  Aimee, Madden               ACCOUNT NO.:  [id number]   MEDICAL RECORD NO.:  [id number]      DATE OF BIRTH:  05/08/1929   PHYSICIAN:  Jonelle Sports. Sevier, M.D.  VISIT DATE:  02/28/2008                                   OFFICE VISIT   HISTORY:  This 77 year old black female is followed for a diabetic ulcer  on the plantar aspect of the right midfoot in association with some  Charcot deformity there.   She was most recently been managed in total contact casting but highly  objects to this method of therapy and in fact today refuses to continue  it.   Other than her unpleasantry from the cast in particularly its weight at  night, she has had no falls.  She has had no pain or any other more  specific problems associated with the wound or with the cast.   PHYSICAL EXAMINATION:  Blood pressure 130/76, pulse 88, respirations 16,  temperature 98 degrees.  The wound on the right plantar midfoot now  measures 1.0 x 0.6 x 0.2 cm and has clean base and clear margins.  There  is evidence of epithelialization at the margins.  This is indeed smaller  than the wound has previously been.   IMPRESSION:  Improvement in diabetic foot ulcer, now Wagner stage 2,  plantar aspect of right foot.   DISPOSITION:  1. No debridement of wound is required today.  2. Discussion was held with the patient and she is told that any form      of treatment will have to stay on permanently just as a cast, but      she is willing to try anything different.   Accordingly today, she is placed in a Cam walker after the wound itself  was protective with a double thickness felt pad and dressed with an  application of silver alginate.  The right lower extremity was then  placed in a Cam walker which was then held in place by a fiberglass  wrap.   Followup visit will be here in 9 days.           ______________________________  Jonelle Sports Cheryll Cockayne, M.D.     RES/MEDQ  D:   02/28/2008  T:  02/29/2008  Job:  [id number]

## 2011-02-02 NOTE — Assessment & Plan Note (Signed)
[facility]   NAME:  [name], [name]               ACCOUNT NO.:  [id number]   MEDICAL RECORD NO.:  [id number]      DATE OF BIRTH:  1929-08-05   PHYSICIAN:  Maxwell Caul, M.D. VISIT DATE:  10/27/2007                                   OFFICE VISIT   [name] returns today in followup for 2 wounds we have been  following.  She has a Wagner's grade 3 area in the middle of a Charcot  deformity in the right foot.  She also has an area on her left fourth  toe.  We have continued to apply Silverlon hydrogel and a total contact  cast to the right foot.  She has been applying topical antibiotics,  Kerlix and a healing sandal to the left foot.  She returns today in  followup.   PHYSICAL EXAMINATION:  Temperature is 97.9, pulse 83, respirations  __________, blood pressure is 139/79.  She continues to have improvement  of the left fourth toe.  I debrided a callus today and found no  underlying wound here.  I think this is essentially resolved.  The area  on her right foot continues to also improve.  I have debrided this area  today using a #15 blade, hemostasis with direct pressure.  Anesthesia  was not necessary.  We will continue to apply Silverlon hydrogel to  offload this with a donut and a total contact cast.  We will see her  back in 1 week.   IMPRESSION:  1. Wagner's grade 3 wound on the right foot in the middle of a      Charcot deformity.  We continue to use a topical silver-based      dressing and a total contact cast.  2. Wagner's grade 2 wound to the left fourth toe.  This is essentially      resolved, and, other than pressure relief, I have returned her to      her own diabetic shoe.           ______________________________  Maxwell Caul, M.D.     MGR/MEDQ  D:  10/27/2007  T:  10/28/2007  Job:  [id number]

## 2011-02-02 NOTE — Assessment & Plan Note (Signed)
[facility]   NAME:  [name], [name]               ACCOUNT NO.:  [id number]   MEDICAL RECORD NO.:  [id number]      DATE OF BIRTH:  11-11-1928   PHYSICIAN:  Jonelle Sports. Sevier, M.D.  VISIT DATE:  12/13/2007                                   OFFICE VISIT   HISTORY:  This 77 year old (birthday today) black female has a Charcot  neural arthropathy of her right foot with a rocker bottom configuration  and has had a plantar midfoot ulcer in association with this.  She had  been managed most recently with a total contact cast with reasonable  tolerance.   She reports difficulty sleeping over the past week which she relates to  the cast but otherwise no increase in pain, no odor, no drainage and no  systemic symptoms.   PHYSICAL EXAMINATION:  Blood pressure 139/81, pulse 87, respirations not  recorded, temperature 98.2. Blood glucose done this morning by the  family said to have been 200 mg/dL.   The wound on the plantar aspect of the right foot today is seen to be  completely healed upon removal from the total contact cast.  There is  some slightly moist loose skin in the area, but the wound itself is  totally healed.   IMPRESSION:  Healing of plantar ulcer, right lower extremity.   DISPOSITION:  1. The loose skin is debrided away without any difficulty.  2. The patient has some Pedors shoes, but these do not provide the      plantar or instep protection that she needs with this degree of      deformity.  Accordingly, she is given a prescription to go to      [employer] for custom molded shoes with custom inserts as well and      with the insert on the right foot to extend up medially to protect      the medial aspect of the foot as well as the plantar surface.  The      shoes will be configured with a slight rocker configuration in      further effort to offload this area.  3. Followup visit will be here in 1 week to see how she is doing in      that it is  anticipated certainly that her custom shoes will not be      ready for several weeks yet.  Her treatment this week will consist      of an Allevyn pad over the former ulcer site with Allevyn      protection extending as well up over the medial aspect of the foot.      Proximal and distal to the old wound site, she will have broad felt      pads, and that foot will be placed in Pedors shoe.           ______________________________  Jonelle Sports. Cheryll Cockayne, M.D.     RES/MEDQ  D:  12/13/2007  T:  12/13/2007  Job:  [id number]

## 2011-02-02 NOTE — Assessment & Plan Note (Signed)
[facility]   NAME:  [name], [name]               ACCOUNT NO.:  [id number]   MEDICAL RECORD NO.:  [id number]      DATE OF BIRTH:  03-Jan-1929   PHYSICIAN:  Jonelle Sports. Sevier, M.D.  VISIT DATE:  01/03/2008                                   OFFICE VISIT   HISTORY:  This 77 year old black female with a rockerbottom  configuration to her right foot, has been managed here for a diabetic  plantar ulcer in the area of bony deformity in that foot.  She was for a  while in total contact casting, more recently has been managed with  simply local dressings.  She was sent after last week's visit to have  custom imprints made for inserts to include one, which will wrap up  around the medial aspect of the instep and which will require a Hi-Top  shoe.  She reports that she has indeed had those imprints taken, but  that she has not received or got it, the inserts or shoes are available.   Although, the patient reports some pain, it is hard to get her to  characterize this and she is extremely impatient about not being  completely healed and ready to depart this clinic, and I think that may  be a factor in some of her unhappiness.  I am not of the impression that  there is significant pain, certainly there is no real tenderness or  anything of that sort.   She has had no fever, chills, or systemic symptoms.  There has been no  drainage and no odor.  She does report that there is slightly more edema  in that extremity than there had been previously.   PHYSICAL EXAMINATION:  Blood pressure 157/82, pulse 82, respirations 16,  and temperature 97.8.  The wound on the plantar aspect of the right  midfoot is completely closed and covered with a small eschar.  There is  indeed 2+ edema to the right lower extremity.   DISPOSITION:  The eschar on the patient's wound is sharply pared away  without difficulty and with no bleeding.  This was done without need for  anesthesia.   Because of  the edema and to protect this foot until such time as the  inserts are available, the patient is today placed in an Unna wrap.  The  family is instructed that if the shoes and inserts are available within  the next week that she bring them with her to this appointment.   Followup visit here will indeed be in 1 week.           ______________________________  Jonelle Sports. Cheryll Cockayne, M.D.     RES/MEDQ  D:  01/03/2008  T:  01/04/2008  Job:  [id number]

## 2011-02-02 NOTE — Assessment & Plan Note (Signed)
[facility]   NAME:  [name], [name]               ACCOUNT NO.:  [id number]   MEDICAL RECORD NO.:  [id number]      DATE OF BIRTH:  [birth year]   PHYSICIAN:  Maxwell Caul, M.D.      VISIT DATE:                                   OFFICE VISIT   [name] is a lady we have been following for a diabetic ulcer on  the plantar aspect of her right midfoot in association with a Charcot  deformity in this region.  This had almost closed over at one point,  however it is reopened.  She was recently transitioned from a total  contact cast to a Cam walker.  She is here today in followup.  She has  had a fiberglass wrap, although I generally think she is cooperative and  her daughter certainly there to supervisor, though I am not completely  certain that this is necessary.  She is not complaining of any pain,  malodor or fever.   PHYSICAL EXAMINATION:  On examination today, she is afebrile.  The wound  had changed in appearance.  I think largely from pressure of surrounding  donut we put her in last time.  There is also some macerated skin  surrounding the wound which underwent excisional debridement to remove  the macerated area.  We used a #10 blade with EMAL for anesthesia,  hemostasis was with multiple silver nitrate sticks.   IMPRESSION:  Wegener's grade 2 diabetic foot wound.  We used iodoform  packing with Aqua Cell AG over the surface of the wound.  We will  continue with a Cam walker as mentioned.  I think the donut has probably  caused some deterioration around this wound and we felt the fiberglass  was probably unnecessary.  We will see her again in 2 weeks.           ______________________________  Maxwell Caul, M.D.     MGR/MEDQ  D:  03/29/2008  T:  03/30/2008  Job:  [id number]

## 2011-02-02 NOTE — Assessment & Plan Note (Signed)
[facility]   NAME:  [name], [name]               ACCOUNT NO.:  [id number]   MEDICAL RECORD NO.:  [id number]      DATE OF BIRTH:  1929/06/12   PHYSICIAN:  Theresia Majors. Tanda Rockers, M.D. VISIT DATE:  02/07/2008                                   OFFICE VISIT   SUBJECTIVE:  [name] is a 77 year old lady who we are following for  Wagner II diabetic foot ulcer.  In the interim, we have treated her with  a total contact cast.  She has also been on Septra and doxycycline.  There has been no interim fever.  She continues to be ambulatory with  the restrictions imposed by the total contact cast.  She is accompanied  by her daughter.   OBJECTIVE:  Blood pressure is 125/64, respirations 18, pulse rate 82,  and temperature 97.9.  Capillary blood glucose is 170 mg percent.  Inspection of the left plantar surface of the foot shows that the ulcer  has decreased in area and volume.  There was healthy granulation with  advancing epithelium from the periphery.  The foot is warm.  The  capillary refill is brisk.  There is no evidence of infection.  The  cultures are outstanding at this point.   ASSESSMENT:  Clinical improvement with offloading with a total contact  cast.   PLAN:  We will continue the total contact cast adding a silver alginate.  We will reevaluate the patient in 1 week.      Harold A. Tanda Rockers, M.D.  Electronically Signed     HAN/MEDQ  D:  02/07/2008  T:  02/08/2008  Job:  [id number]

## 2011-02-02 NOTE — Assessment & Plan Note (Signed)
[facility]   NAME:  [name], [name]               ACCOUNT NO.:  [id number]   MEDICAL RECORD NO.:  [id number]      DATE OF BIRTH:  03/05/1929   PHYSICIAN:  Jonelle Sports. Sevier, M.D.       VISIT DATE:                                   OFFICE VISIT   HISTORY:  This 77 year old black female has been followed for a plantar  midfoot diabetic ulcer in a chronic rocker bottom foot.   The patient insists that this deformity in her foot has been present  since birth, but has only given her problems since she had diabetes.  She had been treated with more conservative measures initially and then  most recently with total contact casting.  Her greatest progress has  been since she has been treated with total contact cast.  She arrives  today thinking that she is probably completely healed.   PHYSICAL EXAMINATION:  VITAL SIGNS:  Blood pressure 99/63, pulse 84,  respirations 16, temperature 97.6.  EXTREMITIES:  The right foot indeed shows a Charcot type rocker bottom  foot with apparent healing and callus formation at the site of the  previous ulceration on the midfoot.  There is some surrounding callus.   IMPRESSION:  Ostensibly healed diabetic foot ulcer right lower  extremity, but feel there is a need to investigate more fully and to  make sure there is not persistent ulceration beneath this callus and  apparent eschar.   DISPOSITION:  The wound is cautiously selectively debrided by removal of  some of the adjacent callus and the callus immediately overlying the  previous wound site.  Indeed a very soft unhealed center is identified  and after debridement this area measures approximately 0.3 x 0.3 x 0.1  cm.  It appears clean.   The matter is explained to the patient that the ulcer is not yet  completely healed and that because her greatest success has been total  contact casting, that I feel she should return to this method of  treatment.  She is reluctant, but  eventually accepts.   The wound is dressed with application of an Allevyn pad.  Felt strips  are placed proximal and distal to the wound to further offload that  area.  The patient is returned to a total contact cast on the right  lower extremity.   FOLLOW UP:  Follow up visit here will be in 1 week.           ______________________________  Jonelle Sports. Cheryll Cockayne, M.D.     RES/MEDQ  D:  12/06/2007  T:  12/06/2007  Job:  [id number]

## 2011-02-02 NOTE — Assessment & Plan Note (Signed)
[facility]   NAME:  [name], [name]               ACCOUNT NO.:  [id number]   MEDICAL RECORD NO.:  [id number]      DATE OF BIRTH:  1929-09-16   PHYSICIAN:  Maxwell Caul, M.D.      VISIT DATE:                                   OFFICE VISIT   We have been following [name] for a diabetic ulcer on the plantar  aspect of her right midfoot and association with Charcot deformity.  She  really had this almost closed over one point by her description, it  reopened.  She recently was transitioned from a total contact cast to a  Cam walker with fiberglass wrap.  She is here today in followup.   PHYSICAL EXAMINATION:  VITAL SIGNS:  Temperature is 97.8, pulse 71,  respiration is 18, and blood pressure is 160/76.  EXTREMITIES:  The wound bed is clear, is clean, and granulating.  There  is no evidence of infection.  I did tear back some nonviable tissue from  around the circumference of the opening of the wound to better identify  the deeper wound margins.  This was done with a #10 blade.  No  anesthesia was necessary.  Hemostasis was with direct pressure.   IMPRESSION:  Diabetic foot ulcer, Wegener's grade 2 plantar aspect of  her right foot which Charcot deformity.  We continued the silver  alginate with double thickness donut in the Cam walker with fiberglass  wrap.  We will see this again in 2 weeks' time.  The wound did not  appear to be infected.  At this point, I am really uncertain whether  this wound is improving or not.           ______________________________  Maxwell Caul, M.D.     MGR/MEDQ  D:  03/14/2008  T:  03/15/2008  Job:  [id number]

## 2011-02-02 NOTE — H&P (Signed)
NAME:  [name], [name]               ACCOUNT NO.:  [id number]   MEDICAL RECORD NO.:  [id number]          PATIENT TYPE:  REC   LOCATION:  FOOT                         FACILITY:  [facility]   PHYSICIAN:  Maxwell Caul, M.D.DATE OF BIRTH:  02-Oct-1928   DATE OF ADMISSION:  10/13/2007  DATE OF DISCHARGE:                              HISTORY & PHYSICAL   HISTORY:  [name] is a lady that we have been following since  initial visit last week.  She has 2 areas of concern, 1 is a Wegener's  grade III area in the middle of a Charcot deformity in the right foot.  She also has an area on her left fourth toe that we are following.  Last  visit we treated the area on her right foot with Silverlon, hydrogel and  placed her in a total contact cast.  She did not tolerate a Cam walker  due to balance difficulties.  We have been applying daily antiseptic  soap washes, Polysporin, Kerlix and a healing sandal to the left toe.  She returns today in followup.   PHYSICAL EXAMINATION:  VITAL SIGNS:  Temperature is 98, pulse 75,  respirations 18, blood pressure 130/83.  EXTREMITIES:  The area on her Charcot area on the right foot appears  much the same as last time.  It has not changed.  It does not need to be  debrided.  There was nothing that needed culturing.  This probes roughly  a centimeter of undermining beyond the obvious margins of the wound,  however, it does not probe to bone.  The left fourth toe looked  improved.  It was epithelialized and I think this will largely heal  over.   IMPRESSION:  Wegener's grade II wounds.  I have continued the Silverlon,  hydrogel, doughnut and a cast to the right plantar Charcot deformity.  The left toe, we have continued the same treatment as last time.  She  will continue in the healing sandal.  We will follow up in 2 weeks.           ______________________________  Maxwell Caul, M.D.     MGR/MEDQ  D:  10/13/2007  T:  10/13/2007  Job:  [id number]

## 2011-02-02 NOTE — Assessment & Plan Note (Signed)
[facility]   NAME:  [name], [name]               ACCOUNT NO.:  [id number]   MEDICAL RECORD NO.:  [id number]      DATE OF BIRTH:  1929/05/29   PHYSICIAN:  Jonelle Sports. Sevier, M.D.  VISIT DATE:  05/09/2008                                   OFFICE VISIT   HISTORY:  This 77 year old black female with a rocker bottom Charcot  configuration of her right foot is being followed for plantar midfoot  ulcer.  She also has compromised peripheral circulation and ABIs done  here on April 17, 2008, showed a right lower extremity ABI of 0.21 with a  very poor signals.  She is pending an appointment on May 16, 2008,  with Dr. Liliane Bade for consideration of further evaluation.  She has  been treated with silver alginate, bulky dressing, and a Cam walker  since last seen here 3 weeks ago.   The patient arrives today saying she has no new problems.  She has been  unaware of any excessive drainage, any odor, any pain, or any fever.  She feels that her ulcers perhaps are smaller to her eye.   PHYSICAL EXAMINATION:  Blood pressure is 132/69, pulse 92, respirations  16, and temperature 97.8.  Blood glucose done at home this morning 200  mg/dL.  The wound on the plantar aspect of the right foot now measures  1.7 x 1.2 x 0.3 cm and shows a rather calloused rim of skin around the  wound margins.  The wound base is reasonably granular and fairly clean.   IMPRESSION:  Gradual progress plantar wound of right foot occurring in  setting of compromised peripheral circulation.   DISPOSITION:  The margins of the wound are sharply trimmed to eliminate  some of this callus and happily there is fairly easy bleeding.  A small  area of undermining at 12 o'clock is obliterated by this procedure.   With the wound base looking healthy at this point, I have decided to use  Oasis to dress this wound and see if this will perhaps start any  epithelialization.   Accordingly after obtaining hemostasis  by pressure and gentle use of  silver nitrate, Oasis was applied in the usual manner to the base of the  wound, held in place with Steri-Strips, covered with an application of  hydrogel and a Mepitel then an absorptive pad.  The patient was then  placed into her walking boot as before.   She was advised not to touch this dressing at home, but that if it  should become saturated to call the clinic and we will change it here.  Meanwhile, she will return in 1 week to the nurse for first dressing  change and in 2 weeks to the physician for evaluation.           ______________________________  Jonelle Sports Cheryll Cockayne, M.D.     RES/MEDQ  D:  05/09/2008  T:  05/09/2008  Job:  [id number]

## 2011-02-02 NOTE — Consult Note (Signed)
[name], [name]               ACCOUNT NO.:  [id number]   MEDICAL RECORD NO.:  [id number]          PATIENT TYPE:  REC   LOCATION:  FOOT                         FACILITY:  [facility]   PHYSICIAN:  Theresia Majors. Tanda Rockers, M.D.DATE OF BIRTH:  1929-04-12   DATE OF CONSULTATION:  09/20/2007  DATE OF DISCHARGE:                                 CONSULTATION   SUBJECTIVE:  [name] is a 78 year old lady referred by Dr. Arbutus Ped for evaluation of ulcer, nonhealing, of the right foot.   IMPRESSION/PLAN:  Deferred.  The patient's caretaker, identified as Ms.  Benton, refused to stay for the history taking and did not allow the  consultation to be completed. Ms. Eliseo Gum ordered the patient to get  dressed and leave.  The patient was apologetic for Ms. Benton's behavior  and stated that she (Ms. Eliseo Gum ) was upset earlier today.      Harold A. Tanda Rockers, M.D.  Electronically Signed     HAN/MEDQ  D:  09/20/2007  T:  09/20/2007  Job:  [id number]   cc:   Max T. Hyatt, D.P.M.

## 2011-02-02 NOTE — Assessment & Plan Note (Signed)
[facility]   NAME:  [name], [name]               ACCOUNT NO.:  [id number]   MEDICAL RECORD NO.:  [id number]      DATE OF BIRTH:  Apr 12, 1929   PHYSICIAN:  Maxwell Caul, M.D. VISIT DATE:  11/24/2007                                   OFFICE VISIT   [name] is a lady we are following for a wound in her right  plantar foot in the setting of a Charcot deformity with midfoot  prolapse.  She has been managed most recently with Aquacel AG and total  contact casting.   When she arrived today, she had a litany of complaints about the cast  but nothing that appeared to be related to the cast per se other than  gait problems etc.  She has no pain, no awareness of odor,  drainage or  other systemic symptoms.   PHYSICAL EXAMINATION:  VITALS:  Temperature is 98.3, pulse 78,  respirations 16, blood pressure 132/66.  The wound measures 0.6 x 0.4 x  0.1, this is healthy looking base and did not require any debridement.  There was no significant evidence of infection.   IMPRESSION:  Gradual and continued improvement of a diabetic foot ulcer  in the setting of right plantar midfoot Charcot deformity.  We have used  a donut and a total contact cast.  I did not think anything specific  needed to be placed on the wound per se.  We will see her again in two  weeks' time.  I am hopeful this will continue to close over without any  other intervention.           ______________________________  Maxwell Caul, M.D.     MGR/MEDQ  D:  11/24/2007  T:  11/24/2007  Job:  [id number]

## 2011-02-02 NOTE — Assessment & Plan Note (Signed)
[facility]   NAME:  [name], [name]               ACCOUNT NO.:  [id number]   MEDICAL RECORD NO.:  [id number]      DATE OF BIRTH:  1928-10-09   PHYSICIAN:  Theresia Majors. Tanda Rockers, M.D. VISIT DATE:  11/08/2007                                   OFFICE VISIT   SUBJECTIVE:  Ms. Heasley is a 77 year old lady who we are following for  Wagner II diabetic foot ulcer with ischemia.  In the interim, she has  been treated with a total contact cast for offloading.  There has been  no interim pain.  There has been no drainage.  There has been no  malodor.  She continues to be minimally ambulatory.  She is accompanied  by daughter.   OBJECTIVE:  Blood pressure is 129/66, respirations 16, pulse rate 90,  temperature is 98.1, capillary blood glucoses 226 mg percent.  Inspection of wound #2 on the right plantar surface shows that the there  has been a minimum decrease in the measurements since the last visit.  There is no drainage.  There is no malodor.  There is healthy-appearing  granulation at the base of the wound.  There is a moderate amount of  callus which did not require paring. The foot is warm, but it is not  feverish.  Capillary refill is moderate.  There is no evidence of  critical ischemia. Review of her ABIs performed in the [facility]ound Center  demonstrates an ABI of 0.4 on the right.   ASSESSMENT:  Wagner II diabetic foot ulcer/? ischemia.   PLAN:  We have recommended that the patient be evaluated by vascular  surgery.  Impressed by the lack of pain and the visual improvement of  the wound, the patient and the daughter are reluctant to accept the  recommendation to proceed with a vascular consultation.  We have  explained to both the patient and the daughter that if this wound does  not show continued and steady improvement with decrease in volume and  complete healing within 6 weeks our strong recommendation is to proceed  directly for a vascular consultation.  We have  emphasized the  possibility of limb loss.  The patient and the daughter remain adamant  in continuing in the offloading mold with a total contact cast.   We will return the patient to the total contact cast with alginate. We  will reevaluate her on a weekly basis.  If there is any evidence of pain  or drainage or discomfort in any way, the patient will be seen in the  Wound Center or in the emergency room for a immediate removal of the  cast and a vascular consultation.  We have given the patient and the  daughter an opportunity to ask questions.  They seem to appreciate the  seriousness of the medical condition and the potential for the limb loss  and they are aware of their election to proceed with TCC is against my  recommendation.  The patient will be reevaluated in 1 week.      Harold A. Tanda Rockers, M.D.  Electronically Signed     HAN/MEDQ  D:  11/08/2007  T:  11/08/2007  Job:  [id number]

## 2011-02-02 NOTE — Assessment & Plan Note (Signed)
[facility]   NAME:  [name], [name]               ACCOUNT NO.:  [id number]   MEDICAL RECORD NO.:  [id number]      DATE OF BIRTH:  1929/03/06   PHYSICIAN:  Maxwell Caul, M.D. VISIT DATE:  10/06/2007                                   OFFICE VISIT   ADDENDUM:  Ms. Flanigan had a Darco sandal with felt off loading donuts  placed.  She tried to take a few steps, the idea was to put the pressure  back on her heel instead of the Charcot area and her medial midfoot,  however she complained of pain and difficulty walking.  I think this was  more significantly altering the mechanics of her gait.  I therefore  discussed this with her daughter and we are going to go ahead and put a  total contact cast on her.  We will see her back in a week for  evaluation of this.           ______________________________  Maxwell Caul, M.D.     MGR/MEDQ  D:  10/06/2007  T:  10/06/2007  Job:  [id number]

## 2011-02-02 NOTE — Assessment & Plan Note (Signed)
[facility]   NAME:  [name], [name]               ACCOUNT NO.:  [id number]   MEDICAL RECORD NO.:  [id number]      DATE OF BIRTH:  11-29-1928   PHYSICIAN:  Jonelle Sports. Sevier, M.D.  VISIT DATE:  11/15/2007                                   OFFICE VISIT   HISTORY:  This 77 year old black female with diabetes and peripheral  vascular disease is being followed for a right plantar ulcer in the  midfoot where there is clear Charcot deformity with midfoot prolapse.  She has been managed with total contact casting.   When seen today, the patient reports that she has had some discomfort  from the cast but nothing severe and nothing any different than it had  been with previous casts.  She has had no awareness of odor or drainage  and has had no fever or systemic symptoms.   Blood pressure is 130/69, pulse 78, respirations 18, temperature 97.6.   The wound now measures 0.6 x 0.5 x 0.3 cm in dimension.  Has a  reasonably healthy-looking base but is in an area of still significant  increase callus.  There is no significant drainage and no undue odor.   IMPRESSION:  Gradual improvement of diabetic foot ulcer right plantar  midfoot.   DISPOSITION:  1. The wound is partial thickness debrided with removal of a lot      adjacent callus and of shaggy wound edges making the wound      considerably more shallow and resulting in flatter wound margins.  2. Following that the wound is covered with an application of aqua      cell a G and that extremity is placed again and total contact cast.  3. Follow-up visit will be here in 1 week.           ______________________________  Jonelle Sports. Cheryll Cockayne, M.D.     RES/MEDQ  D:  11/15/2007  T:  11/15/2007  Job:  [id number]

## 2011-02-02 NOTE — Assessment & Plan Note (Signed)
[facility]   NAME:  [name]               ACCOUNT NO.:  [id number]   MEDICAL RECORD NO.:  [id number]      DATE OF BIRTH:  04/19/1929   PHYSICIAN:  Maxwell Caul, M.D.      VISIT DATE:                                   OFFICE VISIT   HISTORY:  [name] is a patient who was initially seen here on  September 20, 2007.  The  patient's caretaker became somewhat irate  during the history and took her mother home.  They return today in  followup.   The history I obtained is that [name] has had diabetes which is  type 2 since 1970.  She has had a wound on her right foot since May of  last year.  I believe that she had oral antibiotics for this.  Most  recently, her daughter has been applying topical antibiotics to the  wound.  She has diabetic foot wear, however, the wound has not healed.  In discussion with the patient, it is fairly clear she also has diabetic  peripheral neuropathy and a Charcot's foot on the right.   PAST MEDICAL HISTORY:  1. Type 2 diabetes mellitus with probable diabetic neuropathy and      Charcot's foot on the right.  2. Hypertension.  3. Hypothyroidism.  4. Mild cognitive impairment on Aricept.   CURRENT MEDICATIONS:  1. Calcium 500 plus D b.i.d.  2. Levaquin 750 daily.  3. Amlodipine 10 daily.  4. Synthroid 75 daily.  5. Effexor XR 37.5 daily.  6. Aricept 5 daily.  7. NovoLog 70/30 insulin 30 units in the morning 20 units at bedtime.  8. Bactroban 2% b.i.d. to the wounds.   SOCIAL HISTORY:  The patient lives with her daughter.  She uses a quad  cane.  She has limited mobility.  Does walk to the mailbox.  Most of the  walking she does in her home to the bathroom. Occasionally goes to the  grocery store, but not often.   PHYSICAL EXAMINATION:  VITAL SIGNS:  Temperature is 98, pulse 74,  respirations 16, blood pressure 140/80.  HEENT:  She has had an  enucleated left eye.  Otherwise no relevant findings.   CARDIAC:  Heart  sounds are normal.  There were no murmurs.  No carotid bruits.  RESPIRATORY:  Clear entry bilaterally.  There are no crackles.  No  wheezes.  ABDOMEN:  Soft.  There were no masses.  EXTREMITIES:  She has a Charcot foot on the right with significant  deformity.  Her peripheral pulses were very difficult to feel below the  femorals bilaterally.  WOUND EXAM:  There were really 2 areas that we are concerned about.  The  major area was in the middle of a large Charcot deformity on the medial  aspect of the plantar aspect of her right foot.  This measured 0.6 x 0.5  x 0.3, however, there was at least a centimeter of undermining over most  of this wound with callus.  The callus underwent a full-thickness  debridement with a number 15 blade.  Anesthesia was not necessary.  Hemostasis with direct pressure and silver nitrate stick.  Most of the  overhang callus was removed and the wound  edges were properly defined.  There was no evidence of cellulitis here.  Nothing needed to be  cultured.  The left fourth toe had a very thick callus on its most  dependent surface.  The toe actually appears somewhat ischemic.  This  underwent a  debridement with a number 15 blade, again no hemostasis was  necessary.  There was no open wound under the callus.   IMPRESSION:  Wegener's grade II wound in the middle of a right plantar  Charcot deformity.  We applied Silverlon and hydrogel to this area,  fitted her with a Darco sandal and a doughnut to offload the area.  The  patient was asked about a total contact cast and she expressed some  reluctance.  However, I told her I think that ultimately this will need  to be off-loaded with a total contact cast.  To the left fourth toe, we  advised daily washing with antibacterial soap.  She can apply topical  antibiotics, Kerlix and we placed her in a healing sandal.  I will see  the patient's gait before she leaves the clinic.  She does have the   support of her daughter at home.   In terms of her vascular supply, with some difficulty, we did ABIs.  The  one on the left was 0.83.  We had tremendous problems dopplering her  right foot.  We could not obtain a dorsalis pedis.  Ultimately found the  right ankle wave at 40 mmHg.  This would give her a calculated right ABI  of 0.25.  Although I am not surprised she has some degree of peripheral  vascular disease here, I wonder whether the flow was better than that  would suggest.  It is also very clear she has diabetic neuropathy as she  had decreased light touch sensation.   FOLLOW UP:  We will see her again in a week.           ______________________________  Maxwell Caul, M.D.     MGR/MEDQ  D:  10/06/2007  T:  10/06/2007  Job:  [id number]

## 2011-02-02 NOTE — Assessment & Plan Note (Signed)
[facility]   NAME:  WERONIKA, BIRCH               ACCOUNT NO.:  [id number]   MEDICAL RECORD NO.:  [id number]      DATE OF BIRTH:  02/08/1929   PHYSICIAN:  Theresia Majors. Tanda Rockers, M.D.      VISIT DATE:                                   OFFICE VISIT   Ms. [name] is a lady we have been following for quite a period of time  now for a diabetic ulcer on the plantar aspect of her right midfoot in  association with a Charcot deformity in this region.  The wound had  contracted quite nicely at one point; however, it was reopened.  We have  been applying Aquacel Ag to this wound, and she has been using a Cam  walker.  She returns today in followup.   PHYSICAL EXAMINATION:  Temperature is 97.6, pulse 79, respirations 15,  and blood pressure 162/71.  The wound on the foot continues to be  present and if anything is probably slightly worse measuring 3 x 2.2 x  0.7.  There was some maceration of the skin around the wound which I  manually removed with scissors.  We rechecked her ABIs today, on the  left it is 0.6 and on the right 0.29.  Both actually look as though they  have pale monophasic waves.  We also noted a significant amount of  drainage from the wound.   IMPRESSION:  Diabetic wound in the middle of a Charcot deformity.  She  does not complain of pain or anything that sounds like claudication.  Nevertheless, we have repeated her ABIs on the right and found the same  results.  We will therefore go ahead and pursue a vascular surgery  consult with regards to improving perfusion.  With regards to the wound,  we will apply a silver alginate-based dressing with packing and covering  and continue her in a Cam walker.  The dressing will be changed by home  health 2-3 times per week, and we will see her back in 3 weeks.  Hopefully by that time, she will have vascular surgery input.  An x-ray  of the foot was also ordered to look at the underlying bony  abnormalities and to see  if there is plain x-ray evidence of  osteomyelitis.     ______________________________  Maxwell Caul, M.D.      Harold A. Tanda Rockers, M.D.  Electronically Signed    MGR/MEDQ  D:  04/17/2008  T:  04/18/2008  Job:  [id number]

## 2011-02-02 NOTE — Assessment & Plan Note (Signed)
[facility]   NAME:  [name], [name]               ACCOUNT NO.:  [id number]   MEDICAL RECORD NO.:  [id number]      DATE OF BIRTH:  1929/06/26   PHYSICIAN:  Theresia Majors. Tanda Rockers, M.D. VISIT DATE:  02/02/2008                                   OFFICE VISIT   SUBJECTIVE:  [name] is a 77 year old lady who we have followed for  Wagner II diabetic foot ulcer involving the right foot.  In the interim,  she has experienced progressive drainage and malodor.  She started  herself on some left over amoxicillin from her previous injury and  reports that there has been some decrease in the drainage.  She  continues to be ambulatory.  She is accompanied by her daughter.   OBJECTIVE:  Blood pressure is 151/74, respirations are 18, pulse rate is  85, temperature is 98, and capillary blood glucose is 236 mg percent.  Inspection of the right plantar foot shows that the ulcer has a 100%  granulating base with an extensive halo of callus and 1.5 cm of  undermining circumferentially.  There is a serous drainage with malodor  in these areas of undermining.  The foot is warm, but it is not  feverish.  Capillary refill is brisk.  The patient remains insensate to  the [facility] filament.  Using a combination of sharp scissor dissection  and the scalpel, the thickened callus was excised along with a rim of  healthy bleeding tissue.  Hemorrhage was controlled with silver nitrate.  A pressure dressing was left in place.  In the interim, the pressure  dressing was removed.  Hemostasis was assured, and the patient was  placed in a total contact cast with a Silverlon swathe.   ASSESSMENT:  Recurrent Wagner II ulceration.   PLAN:  We will return the patient to a total contact cast and make a  transition to custom orthotics when she is completely re-epithelialized.  We have also started her on Septra DS one p.o. b.i.d. and doxycycline  100 mg p.o. b.i.d. for pain.  She will receive Tylenol #3  one to two  every 4-6 hours p.r.n.  We will reevaluate the patient on Tuesday, Feb 06, 2008.      Harold A. Tanda Rockers, M.D.  Electronically Signed     HAN/MEDQ  D:  02/02/2008  T:  02/03/2008  Job:  [id number]

## 2011-02-02 NOTE — Assessment & Plan Note (Signed)
[facility]   NAME:  [name], [name]               ACCOUNT NO.:  [id number]   MEDICAL RECORD NO.:  [id number]      DATE OF BIRTH:  02-02-1929   PHYSICIAN:  [name]. Tanda Madden, M.D. VISIT DATE:  02/16/2008                                   OFFICE VISIT   SUBJECTIVE:  [name] is a 77 year old lady who we are following for  Wagner 2 diabetic foot ulcer.  She has been treated in a total contact  cast.  She is complaining of heaviness of the cast.  There is no pain.  She continues on Septra and doxycycline.  There has been no interim  fever.  She is accompanied by her daughter.   OBJECTIVE:  Blood pressure is 143/80, respirations 16, pulse rate 92,  temperature is 98, and capillary blood glucose is 480 mg percent.  Inspection of the right lower extremity shows that the foot is warm.  There is no evidence of ischemia.  The ulcer itself shows decrease in  area and volume with healthy-appearing granulation and advancing  epithelium.  There is no excessive drainage or malodor.   ASSESSMENT:  Clinical improvement of Wagner 2 diabetic foot ulcer  associated with adequate offloading.   PLAN:  We will continue the total contact casting and reevaluate the  patient in 2 weeks.      [name], M.D.  Electronically Signed     HAN/MEDQ  D:  02/16/2008  T:  02/17/2008  Job:  [id number]

## 2011-02-05 NOTE — H&P (Signed)
NAMECANNON, Aimee               ACCOUNT NO.:  [id number]   MEDICAL RECORD NO.:  [id number]          PATIENT TYPE:  INP   LOCATION:  0101                         FACILITY:  [facility]   PHYSICIAN:  [name], M.D. DATE OF BIRTH:  03-20-1929   DATE OF ADMISSION:  01/23/2007  DATE OF DISCHARGE:                              HISTORY & PHYSICAL   PRIMARY CARE PHYSICIAN:  [name], M.D.   CHIEF COMPLAINT:  Change in behavior.   HISTORY OF PRESENT ILLNESS:  Ms. [name] is a 77 year old female  with a past medical history of diabetes mellitus, hypothyroidism, and  hypertension.  She is awake but has some confusion.  Therefore, her  daughters, including Ms.  [name], gives the history.  According  to the patient's daughter, the patient developed some urine and stool  incontinence over the past weekend which is 1-2 days ago.  This is new  for the patient.  Over the course of the weekend, the patient attempted  to walk to the bathroom but lost her bowels prior to making it to the  toilet.  Ever since the weekend, the patient has not been quite herself.  This morning the patient experienced some confusion as well as some  agitation.  The patient ended up being very confrontational with her  sister earlier this morning, and that is not her typical self.  She,  however, has maintained the ability to ambulate.  According to the  patient's daughter, the patient was taken to the bank earlier today, and  she was able to ambulate from the car to the bank without assistance.  The patient has experienced some lethargy and some slurred speech today,  however.  She also has a cough, which is chronic, as well as some clear  discharge from her left eye.  There has been no complaint of nausea,  vomiting, fevers, or chills.  The patient's sugars have been fluctuating  for quite some time as far back as a month.  They state that the patient  has episodes of hypoglycemia alternating with  episodes of uncontrolled  sugars.  They are very vague with regards to what the sugars were at  home earlier today; however, they state that initially the sugars were  up in the 200s.  Later when they checked, it had declined to 77.  The  patient denies having any loss of consciousness, no presyncopal type of  sensations.   PAST MEDICAL HISTORY:  1. Diabetes mellitus.  2. Hypothyroidism.  3. Hypertension.  4. 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  5. Questionable early onset dementia.   PAST SURGICAL HISTORY:  1. Rectal polyps removed.  2. Left eye removed in 1972 secondary to an accident involving an      object.   ALLERGIES:  None.   CURRENT HOME MEDICATIONS:  1. Trazodone 150 mg 1/3 of a tablet in the morning, 1-2 tablets in the      evening.  2. Levothyroxine 75 mcg daily.  3. Hydrochlorothiazide 25 mg p.o. daily.  4. Diphenoxylate/atropine tablets 1-2 tablets q. 6 h p.r.n.  5.  Vita D 1.25 mg soft gel p.r.n.  6. Humulin insulin 70/30, 50 units in the morning, 30 units in the      evening.   SOCIAL HISTORY:  Cigarettes:  None.  Alcohol: Stopped 15 years ago.  The  patient was an alcoholic 30 years ago prior to cessation of alcohol.   FAMILY HISTORY:  Mother has a history of diabetes mellitus and  Alzheimer's dementia.  Father has history of Alzheimer's dementia and  diabetes mellitus.   REVIEW OF SYSTEMS:  As per HPI.   PHYSICAL EXAMINATION:  GENERAL:  The patient is awake.  She is  conversant.  She is in no obvious distress.  VITAL SIGNS:  Temperature 98.0, blood pressure 161/70, heart rate 51,  respirations 20, O2 saturation 97% on room air.  HEENT:  Normocephalic and atraumatic.  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  The right  eye is anicteric.  Extraocular movements are intact.  Oral mucosa is  pink.  No thrush, no exudate.  NECK:  Supple.  There is palpable left-sided posterior cervical  lymphadenopathy. Thyroid is not palpable.  CARDIAC:  S1, S2 present.   Regular rate and rhythm.  RESPIRATORY:  No crackles or wheezes.  ABDOMEN:  Soft, flat, nontender, nondistended.  Positive bowel sounds.  EXTREMITIES:  No leg edema.  NEUROLOGIC: The patient is alert and oriented to person only.  No facial  droop.  Grip strength is equal bilaterally and symmetrical.  No focal  motor deficits.  MUSCULOSKELETAL:  5/5 upper and lower extremity strength.   LABORATORY DATA:  White blood cell count 10.6, hemoglobin 12.2,  hematocrit 35.8, platelets 323.  Sodium 119, potassium 2.6, chloride 81,  CO2 26, BUN 4, creatinine 0.63, glucose 181.  Total bilirubin 0.7,  alkaline phosphatase 77, SGOT 21, SGPT 12. Total protein 6.7, albumin  3.4, calcium 9.6.  Urinalysis negative.   Portable chest x-ray:  Cardiomegaly and minimal bibasilar atelectasis.   ASSESSMENT AND PLAN:  1. Acute onset behavioral change.  The etiology of this is      questionable.  Whether or not severe hypoglycemia precipitated this      is questionable.  The ER physician indicated that the patient's      sugar was noted to be 6 while in the ER.  Therefore, The patient      may have had a hypoglycemic event which may have contributed to her      change in behavior.  However, given the fact that the patient has      also had acute onset confusion, personality changes, as well as      slurred speech, one has to be concerned about the possibility of a      transient ischemic attack versus cerebrovascular accident.      Therefore, will temporarily hold the patient's hypoglycemic agents      for now and perform frequent Accu-Chek's as well as neurologic      checks.  Will check a CT scan of the patient's head as well as MRI      to rule out cerebrovascular accident.  Will also check a B12, RPR,      and TSH levels.  2. Acute onset urine and stool incontinence.  The etiology of this is      questionable. Will again start the workup with images of the brain     consisting of CT scan  of the head and  MRI.  3. Severe hypoglycemia.  Whether or not this contributed to  the      patient's current behavioral change is questionable.  Will hold the      patient's diabetic agents for now.  Will perform frequent Accu-      Chek's as well as provide sliding scale insulin.  4. History of hypothyroidism.  Will check a TSH, T4, and resume her      levothyroxine.  5. Hypertension.  This is currently uncontrolled.  Will start the      patient off on Norvasc for now.  6. Gastrointestinal prophylaxis.  Will start Protonix.  7. Deep vein thrombosis prophylaxis.  Will provide Lovenox.  8. Severe hypokalemia.  Will supplement this with potassium      supplementation.  9. Severe hyponatremia.  It is highly likely that this may have been a      contributing factor to the patients behavior change.  Will provide      normal saline for now.  Will also order sodium studies.      [name], M.D.  Electronically Signed     OR/MEDQ  D:  01/23/2007  T:  01/23/2007  Job:  [id number]   cc:   [name], M.D.  Fax: [phone]

## 2011-02-05 NOTE — Discharge Summary (Signed)
[name], [name]               ACCOUNT NO.:  [id number]   MEDICAL RECORD NO.:  [id number]          PATIENT TYPE:  INP   LOCATION:  1429                         [facility]AC[facility]  [facility]   PHYSICIAN:  Thomasenia Bottoms, MDDATE OF BIRTH:  02/04/1929   DATE OF ADMISSION:  01/23/2007  DATE OF DISCHARGE:  01/27/2007                               DISCHARGE SUMMARY   Please see H&P dictated Jan 23, 2007, for details of her admission.   BRIEF SUMMARY OF HOSPITAL COURSE:  1. [name] is a 78 year old who presented essentially with acute      behavioral change and acute delirium.  She was found to have a      blood sugar of 6 in the emergency department.  She was also      significantly hyponatremic with a sodium of 119.  The patient was      admitted to the telemetry unit.  Her blood sugars were corrected.      Her sodium was corrected with IV fluids.  She was found to be      dehydrated.  She also had significant hypokalemia which was      treated.  The patient was previously on hydrochlorothiazide which      was discontinued for her high blood pressure, which was quite high      during this stay.  She was placed on Norvasc.  Her blood pressure      still slightly suboptimal with a systolic of 170, so an ACE      inhibitor will also be added.  2. Diabetes.  The patient was significantly hypoglycemic on arrival,      but shortly thereafter she had significant hyperglycemia.  The      patient's 70/30 was discontinued, and she is now on Lantus.  She      was on 15 units a day but her blood sugars were still approximately      low 200s, so her Lantus is now going to be increased to 20 units      subcu at bedtime on discharge and this will need to be further      titrated up after discharge.  3. Acute delirium.  This resolved, but it is clear that the patient      has some baseline dementia and this was discussed with the      patient's family.  4. Microvascular cerebral disease.  Her head CT  revealed evidence of      microvascular disease, though her MRI did not reveal any evidence      of acute CVA.  We did consult neurology during this hospital stay      because of the MRA result which revealed some moderate to severe      stenosis in the cavernous carotids bilaterally.  No stents were      recommended because it was felt that the risk outweighed the      benefit for this patient.  One reason is because there is no      clinical evidence to prove that this is beneficial, but the  second      is that the patient is not symptomatic from this lesion at this      time.  So with that, we will start the patient on aspirin as she      was not taking this before and control of her other risk factors      including her blood pressure and diabetes.  5. Hypothyroidism.  Her TSH was within normal limits, so we continued      her 75 mcg p.o. daily.  6. Depression.  The patient previously was taking trazodone but it was      recommended by the family that we put her on a different agent.      So, the patient will be started on Effexor.   SUMMARY OF STUDIES:  Done during this hospital stay include:  A head CT  which revealed age-indeterminate lacunar infarcts of the thalami eye and  periventricular white matter changes consistent with microvascular  ischemia.  The patient had an MRI and an MRA of the brain which revealed  no evidence of acute infarct.  It was quite limited because of motion  artifact but there was some moderate to severe stenosis of the cavernous  carotids bilaterally and moderate stenosis of the mid right posterior  cerebral artery.  Other results:  Her B12 level was 305, which is within  normal limits but on the low side.  Her TSH was normal at 1.056.  RPR  was nonreactive.  Hemoglobin A1c was elevated at 6.9.  On the morning of  discharge, her white count was 10.7, hemoglobin 12, hematocrit 35.7, and  platelet count 297.  Her sodium is slightly low at 131, potassium  3.9,  chloride 99, bicarb 25, BUN 3, creatinine 0.75, and glucose in the  morning was 218.   DISCHARGE MEDICATIONS:  1. Enteric-coated aspirin 325 mg p.o. every day.  2. Levothyroxine 75 mcg p.o. daily.  3. Lantus insulin 20 units subcu at bedtime.  4. Norvasc 10 mg p.o. daily for blood pressure.  5. Lisinopril 5 mg p.o. daily for high blood pressure.  6. Effexor 37.5 mg p.o. daily for depression.   DIAGNOSES:  Please add mild dementia to the patient's diagnoses with a  fixation on her bowels.      Thomasenia Bottoms, MD  Electronically Signed     CVC/MEDQ  D:  01/27/2007  T:  01/27/2007  Job:  [id number]   cc:   [facility]  [address]

## 2011-02-05 NOTE — Consult Note (Signed)
NAMEKINESHA, AUTEN               ACCOUNT NO.:  [id number]   MEDICAL RECORD NO.:  [id number]          PATIENT TYPE:  INP   LOCATION:  1429                         [facility]ACILITY:  [facility]   PHYSICIAN:  Deanna Artis. Hickling, M.D.DATE OF BIRTH:  [birth year]   DATE OF CONSULTATION:  DATE OF DISCHARGE:                                 CONSULTATION   CHIEF COMPLAINT:  Cranial arterial vascular stenosis.   HISTORY OF THE PRESENT CONDITION:  Hamdi Vari is a 77 year old woman  who was admitted to [facility] with delirium.  The patient was  confused and had urinary and fecal incontinence in the days leading up  to her admission.  She was not able to control her bowels and bladder  despite being aware that she needed to eliminate.  The patient has been  agitated as well as confused but has been lethargic with slurred speech.  She had a chronic productive cough, clear discharge from her enucleated  left eye.  The patient has had wild fluctuation of her sugars from very  high to very low.  In the hospital was measured as low as 6.  The  patient had an MRI scan of the brain which did not show evidence of  restricted diffusion changes characteristic of stroke.  It did, however,  show evidence of significant stenosis in both cavernous carotids and  also the right posterior cerebral artery in the T2 segment.  No other  significant stenoses were seen.   In this setting, I was asked to see the patient to determine whether  further workup and treatment was needed.  Her risk factors for stroke  include hypertension and diabetes.  Other medical problems include  hypothyroidism and foot disease related to diabetic neuropathy and  vasculopathy.  The patient also has possible early-onset dementia.   PAST SURGICAL HISTORY:  Rectal polyps, enucleation of the left eye in  1972.   HOME MEDICINES:  1. Trazodone 150 mg one-third of a tablet in the morning, one to two      tablets at nighttime.  2.  Levothyroxine 75 mcg a day.  3. Hydrochlorothiazide 25 mg daily.  4. Diphenoxylate atropine tablets one to two every 6 hours as needed.  5. Vitamin D 1.25 mg soft gel as needed?  6. Humulin insulin 70/30, 50 units in the morning, 30 units at      nighttime.   FAMILY HISTORY:  Both parents have the diabetes and Alzheimer's  dementia.   SOCIAL HISTORY:  The patient does not use tobacco.  She stopped using  alcohol 15 years ago, was an alcoholic 30 years ago.   REVIEW OF SYSTEMS:  Recorded in the chart.  Positive for dizziness,  constipation and diarrhea, urinary and fecal frequency and urgency,  osteoarthritis, depression and anxiety.   In the hospital, the patient's glucose was dropped because she had a  glucose of 6.  She also had a sodium of 119.  CT scan of the brain  carried out showed mild atrophy, periventricular white matter disease,  subcortical lacunes in the area of the thalamus bilaterally.  No  signs  of hemorrhage.  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  The patient's  MRI was insufficient to add to these conclusions.   The patient's sodium has risen from 119 to 131.  The patient has normal  renal function, normocytic anemia with a hemoglobin of 11.7, MCV 86.9.  She had hypokalemia on admission 2.6, which is repaired to 3.8.  Hemoglobin A1c interestingly was 6.9 despite her wild and unpredictable  control.  Albumin low at 3.4.  Other liver functions were normal.  T4  9.8, TSH 1.056.  Vitamin B12 3305, prealbumin low at 11.4.  RPR  nonreactive.  Urinalysis:  No sign of urinary tract infection.   EXAMINATION TODAY:  VITAL SIGNS:  Blood pressure 179/76, resting pulse  95, respirations 18.  Capillary glucose 94 to 340, currently 236.  Intake and output was +340 on May7, so far today -2520 related to her  poor diabetic control.  EAR, NOSE AND THROAT:  No bruits.  Supple neck.  No infection.  LUNGS:  Clear.  HEART:  No murmurs.  Pulses normal.  ABDOMEN:  Soft.  Bowel  sounds normal.  No hepatosplenomegaly.  EXTREMITIES:  Normal.  NEUROLOGIC:  The patient is sleepy, somewhat disoriented except to May  and her name.  Names objects, follows commands.  Cranial nerves:  Visual  fields full.  Right eye pupil reacts.  Cannot see the fundus.  The  patient has a prosthetic left eye.  Symmetric facial strength, midline  tongue.  Motor examination:  Normal strength in her arms and legs.  Good  fine motor movements.  Sensory examination:  Peripheral polyneuropathy,  good stereognosis.  Cerebellar examination:  No dysmetria.  Gait not  tested.  The patient is areflexic.  She has bilateral flexor plantar  responses.   IMPRESSION:  1. Delirium secondary to hypoglycemia and hyponatremia.  This seems      have improved. (293.0)  2. Dementia.  I will have to reassess her when she is more awake.  I      think she was too sleepy to get a good assessment of her cognitive      function. (294.3)  3. Type 2 diabetes mellitus, insulin-dependent.  4. No evidence of acute cerebral infarction.  The patient has remote      lacunar infarctions and subcortical white matter disease.  The      patient has possible left severe cavernous carotid disease      bilaterally.  MRA overestimates the degree of stenosis.  The      options to sort this issue out are CT angiogram versus routine      catheter angiogram.  I will discuss this with radiology to      determine which is best.  This will estimate the degree of      stenosis.  Even if it is found, however, this is a difficult      location to place a stent.  Even if a stent was properly deployed,      there is no guarantee of restenosis.  I will discuss this with Dr.      Pearlean Brownie.  I am reluctant to be aggressive in this woman.  I note that      her echocardiogram showed an ejection fraction of 65-75% and      carotid Doppler is pending at this time.  I appreciate the opportunity to see her care.  If you have questions or  I can be  of  assistance, do not hesitate to contact me.      Deanna Artis. Sharene Skeans, M.D.  Electronically Signed     WHH/MEDQ  D:  01/26/2007  T:  01/27/2007  Job:  [id number]   cc:   Thomasenia Bottoms, MD

## 2011-03-16 ENCOUNTER — Other Ambulatory Visit (HOSPITAL_COMMUNITY): Payer: Self-pay | Admitting: Geriatric Medicine

## 2011-03-18 ENCOUNTER — Ambulatory Visit (HOSPITAL_COMMUNITY)
Admission: RE | Admit: 2011-03-18 | Discharge: 2011-03-18 | Disposition: A | Discharge status: Home / Self Care | Payer: Medicare Other | Source: Ambulatory Visit | Attending: Geriatric Medicine | Admitting: Geriatric Medicine

## 2011-03-18 ENCOUNTER — Ambulatory Visit (HOSPITAL_COMMUNITY)
Admission: RE | Admit: 2011-03-18 | Discharge: 2011-03-18 | Payer: Medicare Other | Source: Ambulatory Visit | Attending: Geriatric Medicine | Admitting: Geriatric Medicine

## 2011-03-18 DIAGNOSIS — R1310 Dysphagia, unspecified: Secondary | ICD-10-CM | POA: Insufficient documentation

## 2011-03-18 DIAGNOSIS — R05 Cough: Secondary | ICD-10-CM | POA: Insufficient documentation

## 2011-03-18 DIAGNOSIS — R059 Cough, unspecified: Secondary | ICD-10-CM | POA: Insufficient documentation

## 2011-03-27 ENCOUNTER — Other Ambulatory Visit: Payer: Self-pay | Admitting: Nurse Practitioner

## 2011-06-17 ENCOUNTER — Other Ambulatory Visit: Payer: Self-pay | Admitting: Otolaryngology

## 2011-06-17 DIAGNOSIS — E042 Nontoxic multinodular goiter: Secondary | ICD-10-CM

## 2011-06-29 ENCOUNTER — Other Ambulatory Visit: Payer: Medicare Other

## 2011-07-06 ENCOUNTER — Other Ambulatory Visit: Payer: Medicare Other

## 2011-07-06 ENCOUNTER — Inpatient Hospital Stay: Admission: RE | Admit: 2011-07-06 | Payer: Medicare Other | Source: Ambulatory Visit

## 2011-08-02 ENCOUNTER — Telehealth: Payer: Self-pay | Admitting: Emergency Medicine

## 2011-08-02 NOTE — Telephone Encounter (Signed)
MADE OFFICE AWARE THAT PT. NEVER CAME IN FOR U/S AND BX OF THYROID.

## 2011-11-23 ENCOUNTER — Ambulatory Visit: Payer: Medicare Other | Admitting: Endocrinology

## 2011-12-13 ENCOUNTER — Ambulatory Visit: Payer: Medicare Other | Admitting: Endocrinology

## 2011-12-13 DIAGNOSIS — Z0289 Encounter for other administrative examinations: Secondary | ICD-10-CM

## 2011-12-31 IMAGING — CR DG CHEST 2V
2 series · 2 of 2 positions shown · non-contrast
Comparison: 11/03/2009

CLINICAL DATA: Left foot pain.  Diabetes.  Infection.

CHEST - 2 VIEW

[w chest lat *]
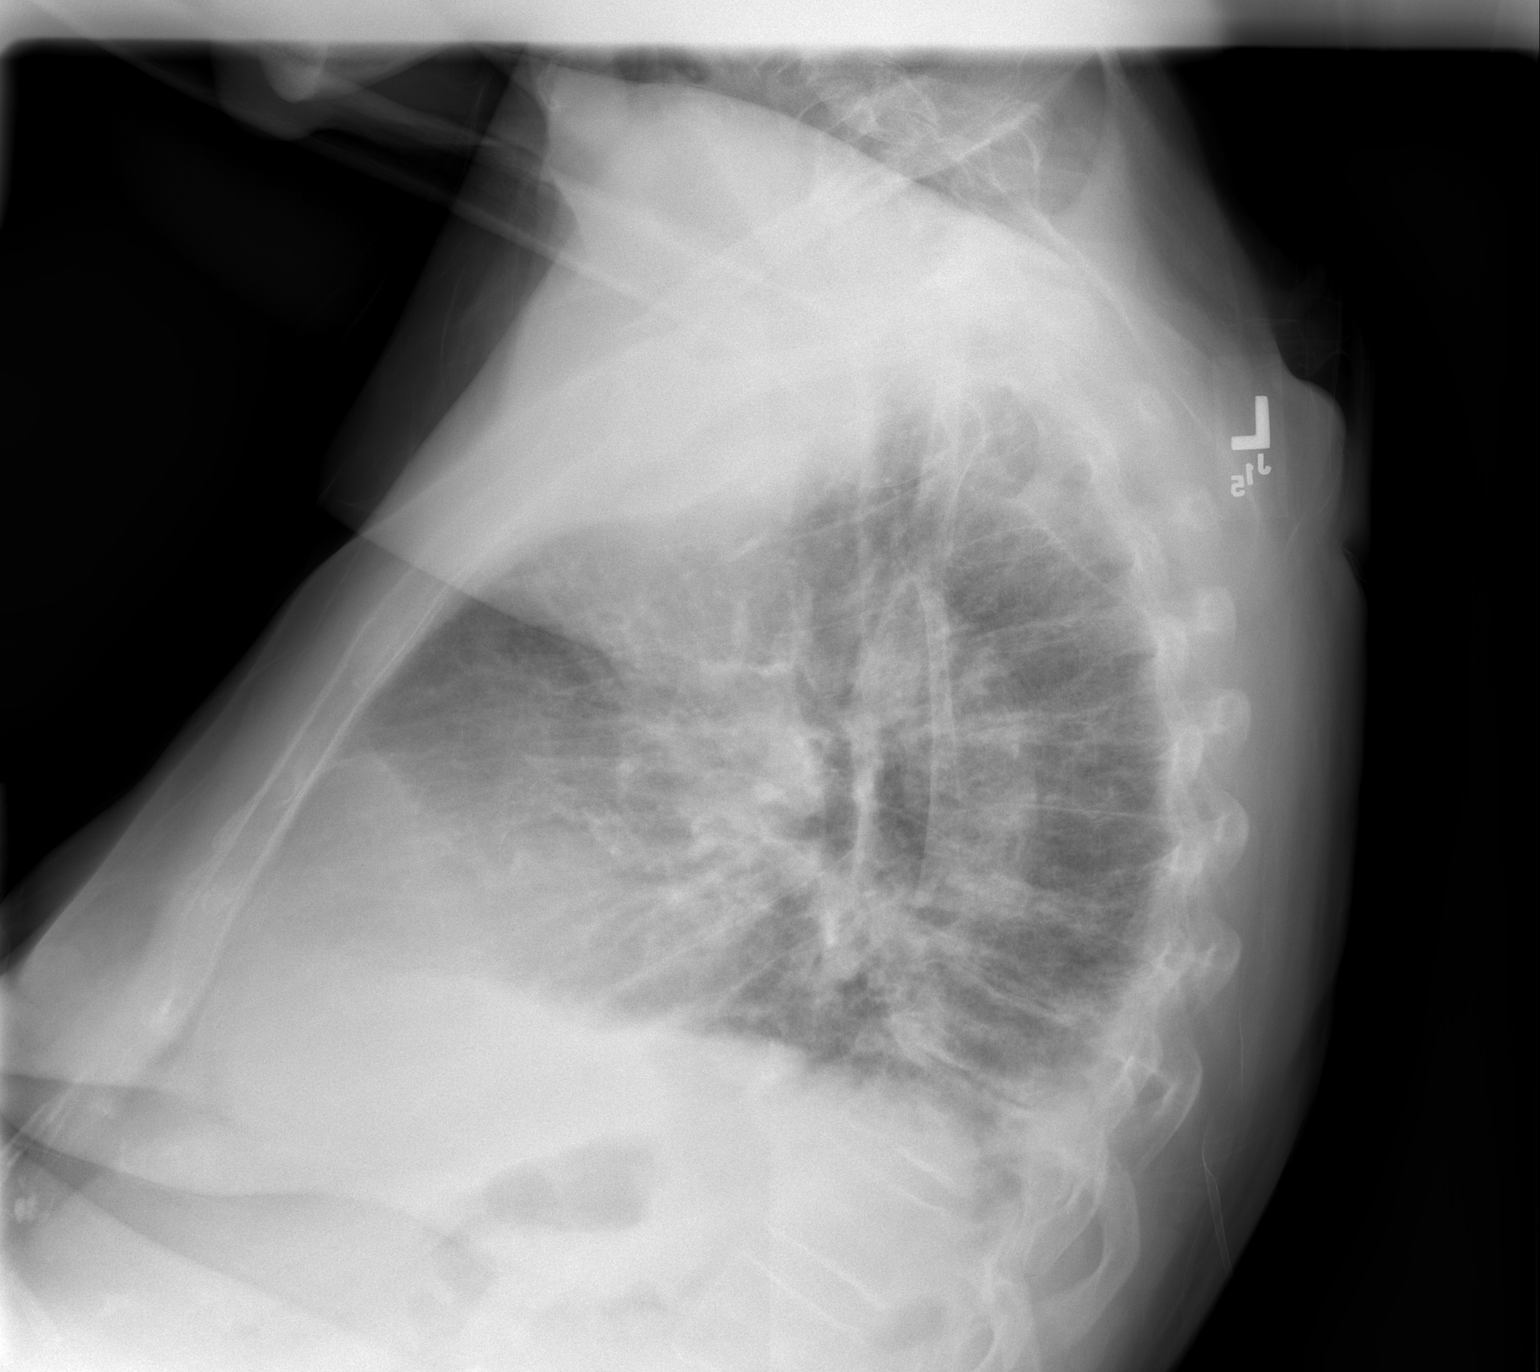

[view not recorded]
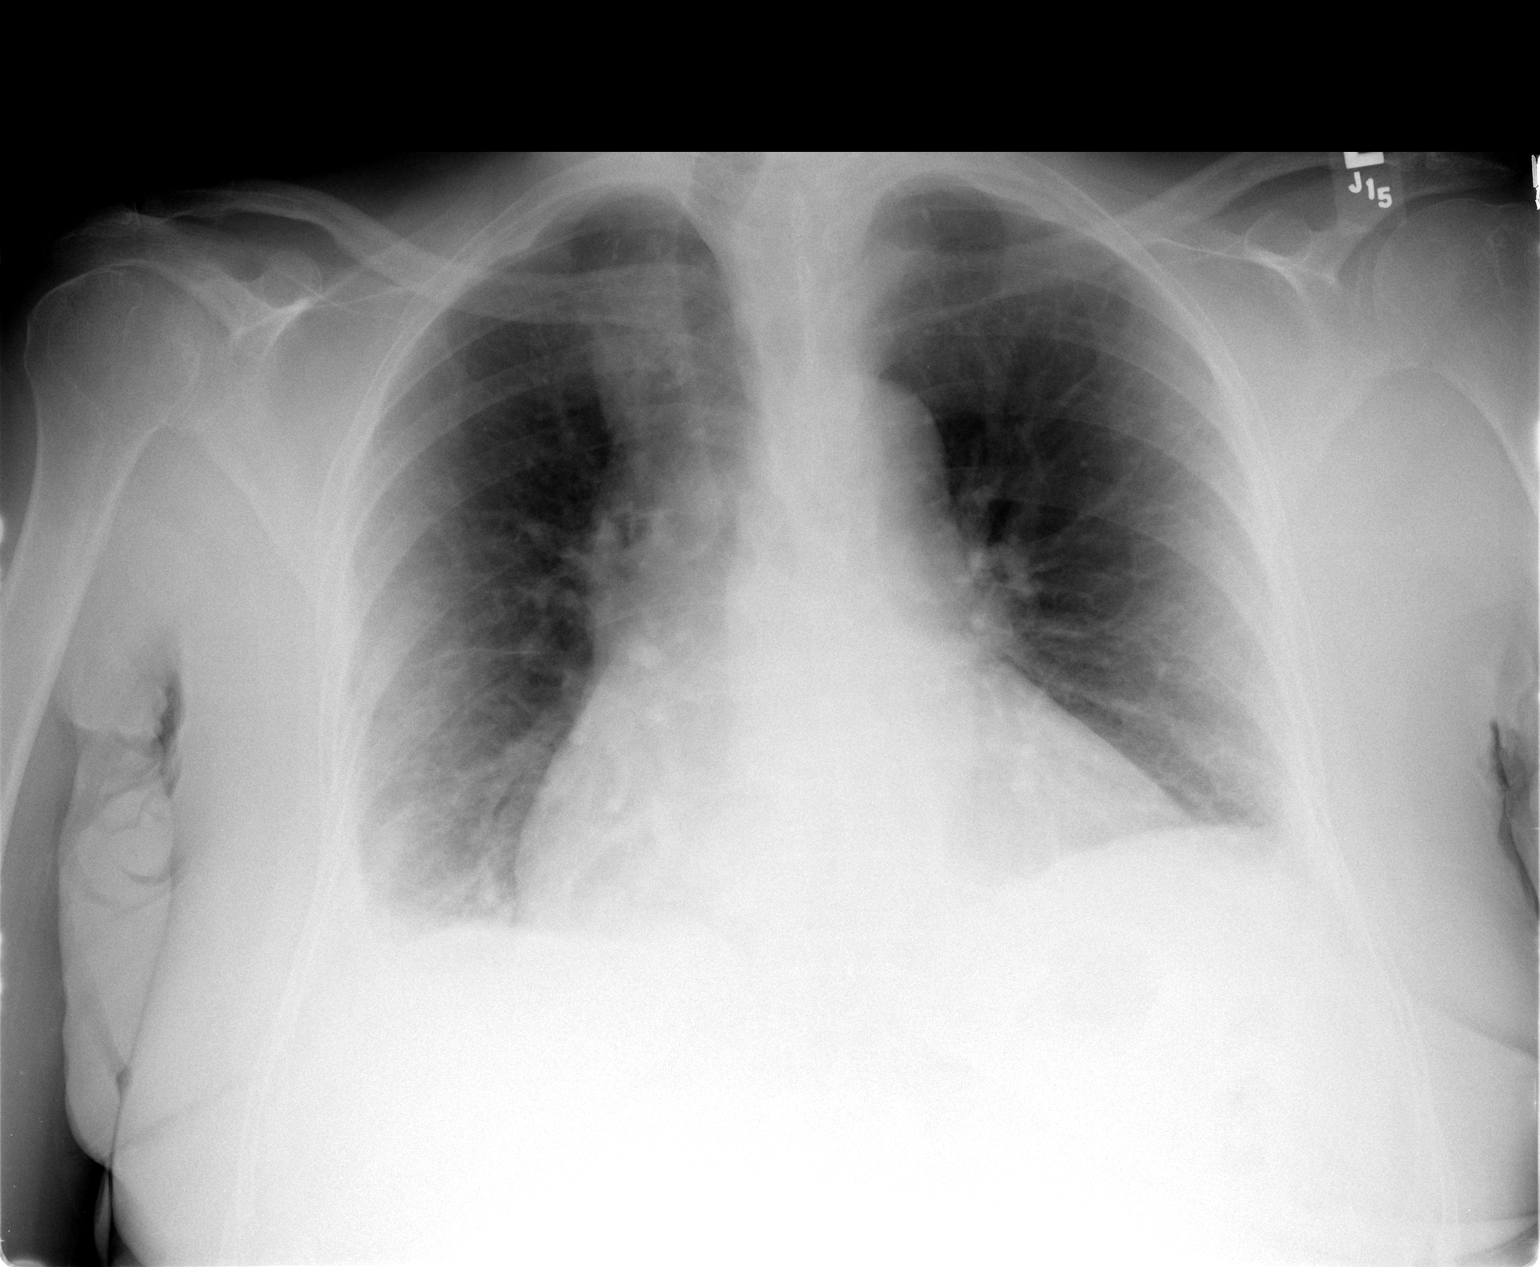

[2 of 2 positions shown; findings below may reference images not displayed]

FINDINGS: The patient is rotated to the right on today's exam,
resulting in reduced diagnostic sensitivity and specificity.   Mild
cardiomegaly noted.

There is blunting of the costophrenic angles, right greater than
left, suggesting small bilateral pleural effusions.

There is mild passive atelectasis at the lung bases.

Low lung volumes are present, causing crowding of the pulmonary
vasculature.
IMPRESSION: 1.  Cardiomegaly without edema.
2.  Small bilateral pleural effusions with passive atelectasis.
3.  Low lung volumes.

## 2011-12-31 IMAGING — CR DG FOOT COMPLETE 3+V*L*
3 series · 3 of 3 positions shown · non-contrast
Comparison: None.

CLINICAL DATA: Diabetes.  Left foot pain.

LEFT FOOT - COMPLETE 3+ VIEW

[view not recorded (1 of 3)]
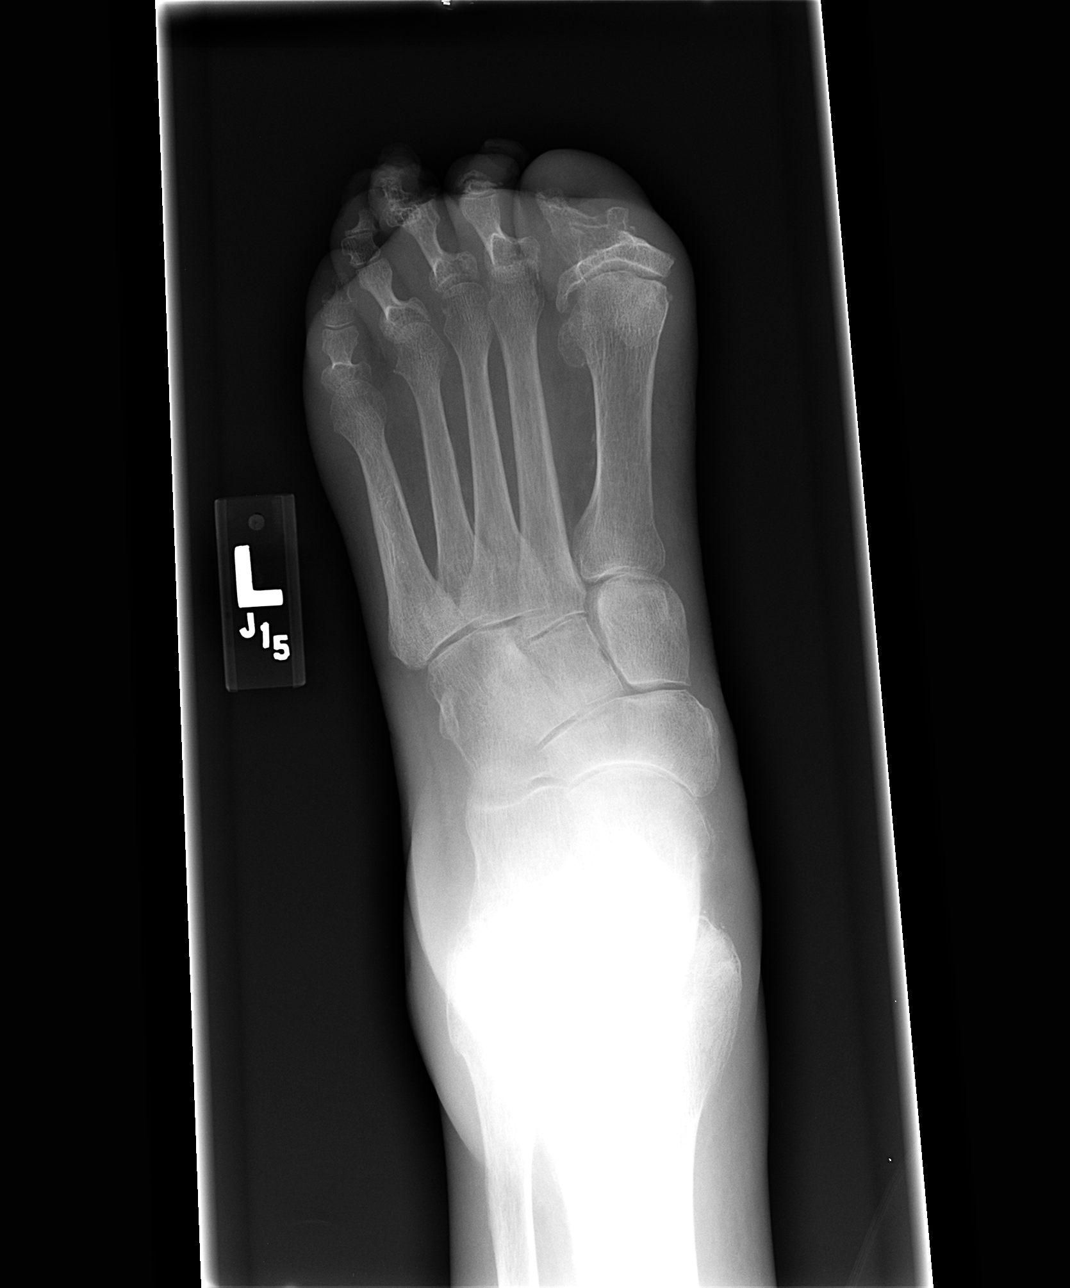

[view not recorded (2 of 3)]
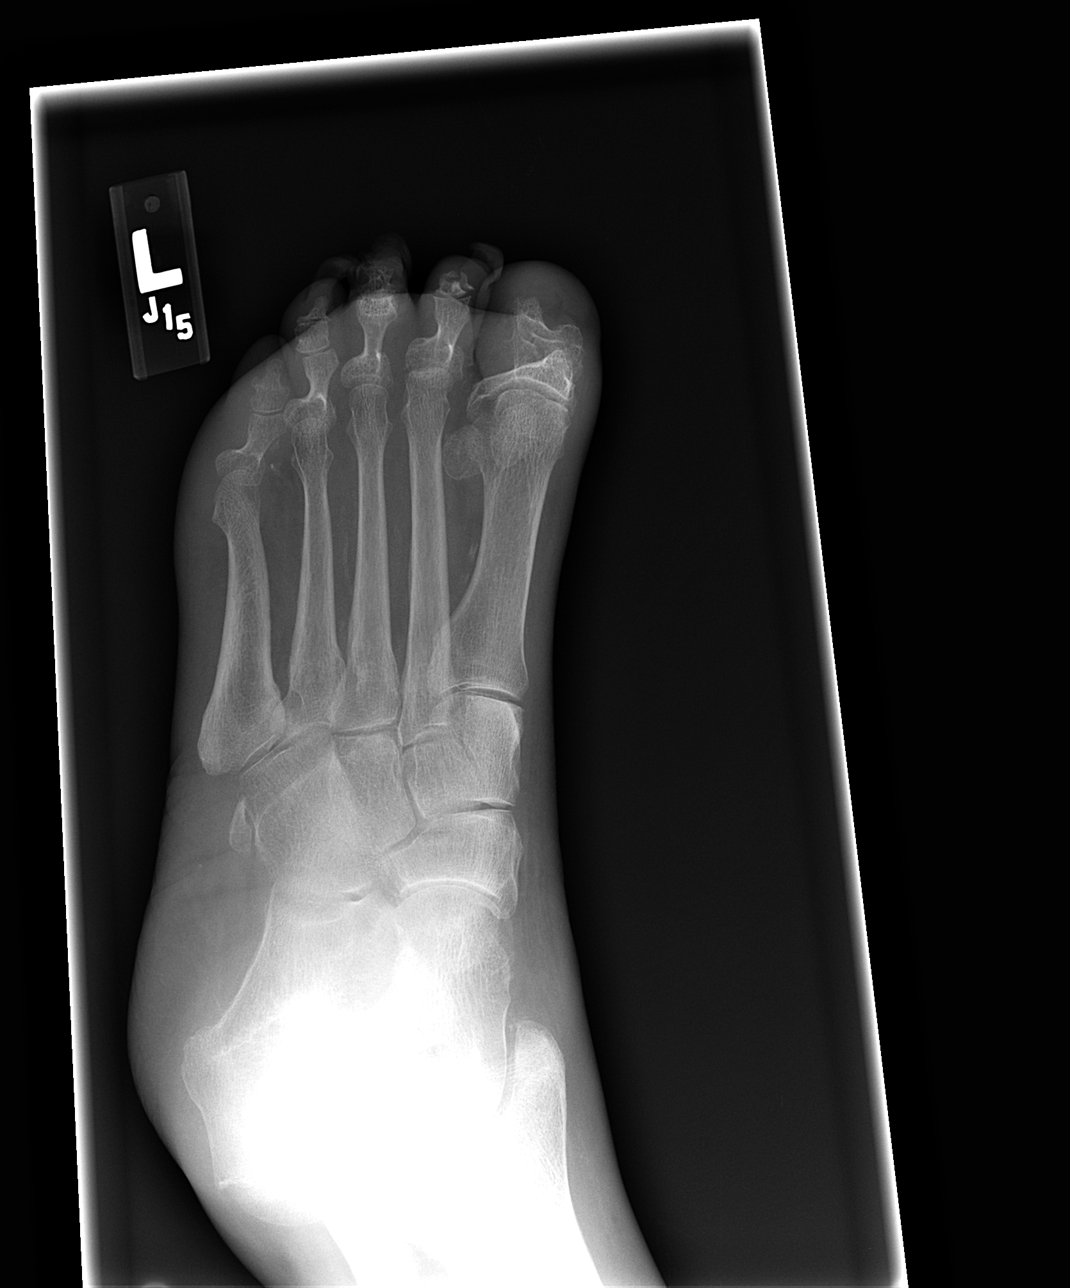

[view not recorded (3 of 3)]
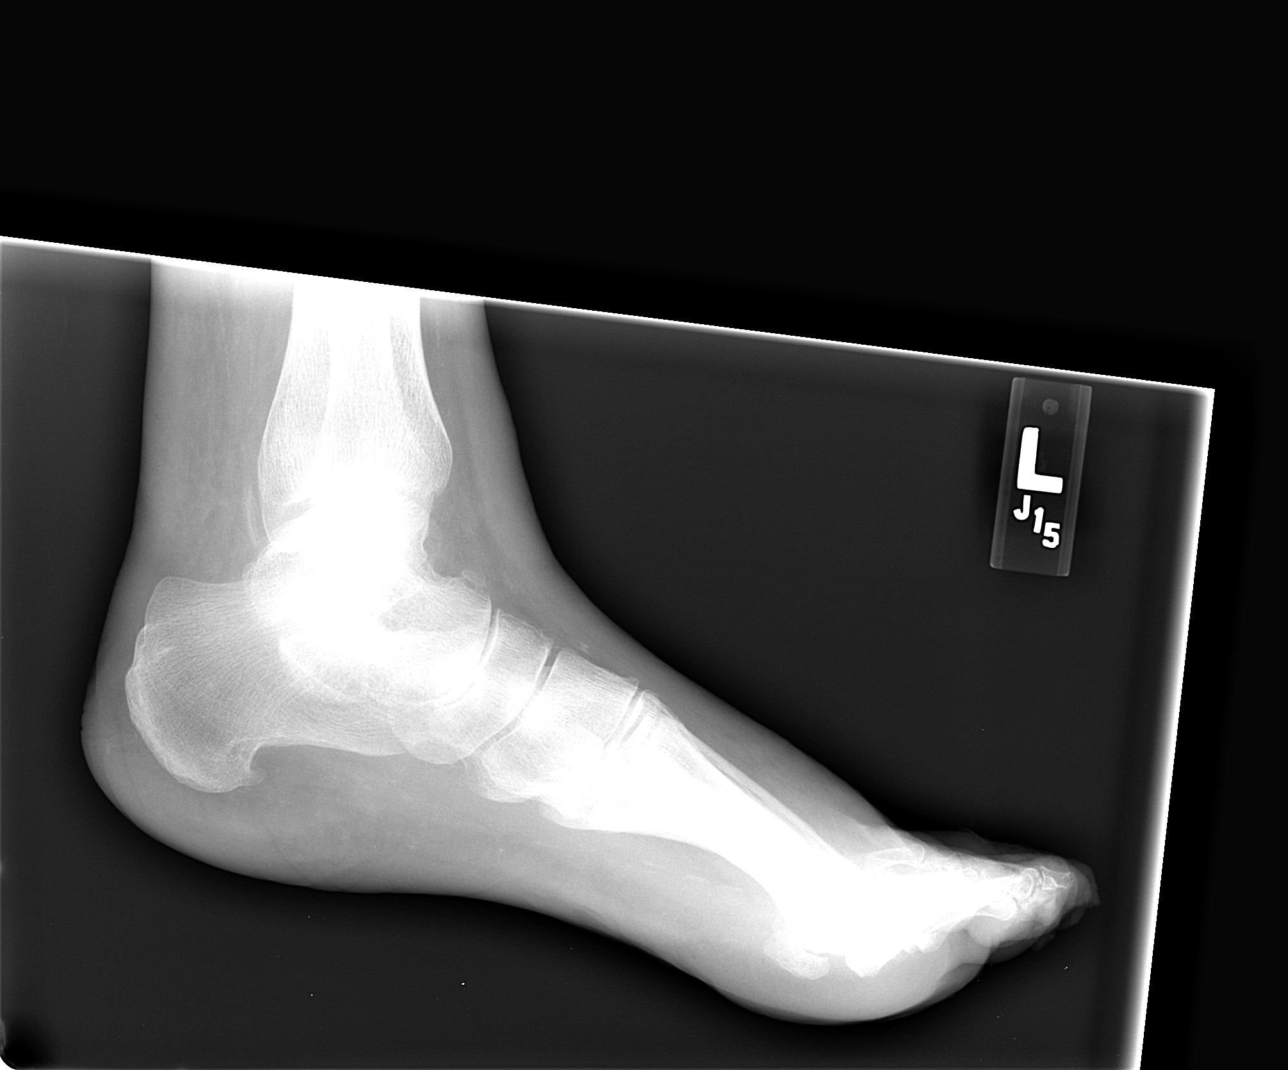

[3 of 3 positions shown; findings below may reference images not displayed]

FINDINGS: Deformity of the great toe noted.  This may be
exaggerated by the apparent extension of the toe on imaging.

There is irregularity of the middle and distal phalanges of the
second toe, probably from chronic deformity although I cannot
completely exclude a fracture involving the distal phalanx.

An os peroneus is present.

Alignment at the Lisfranc joint appears normal.  Plantar calcaneal
spur is present.

Dorsal soft tissue swelling noted along the distal foot.  There is
mild spurring of the talar head.

Long toenails noted of the second through fourth digits.
IMPRESSION: 1.  Deformity of the middle and distal phalanges of the first and
second toes, possibly a somewhat exaggerated by the bending of the
toes during imaging.
2.  No specific findings of active osteomyelitis, although MRI can
provide greater diagnostic sensitivity.
3.  Plantar calcaneal spur.
4.  Soft tissue swelling dorsally along the distal foot.

## 2013-09-10 ENCOUNTER — Ambulatory Visit (INDEPENDENT_AMBULATORY_CARE_PROVIDER_SITE_OTHER): Payer: Medicare Other | Admitting: Internal Medicine

## 2013-09-10 ENCOUNTER — Other Ambulatory Visit: Payer: Self-pay | Admitting: Internal Medicine

## 2013-09-10 ENCOUNTER — Other Ambulatory Visit (INDEPENDENT_AMBULATORY_CARE_PROVIDER_SITE_OTHER): Payer: Medicare Other

## 2013-09-10 ENCOUNTER — Encounter: Payer: Self-pay | Admitting: Internal Medicine

## 2013-09-10 ENCOUNTER — Ambulatory Visit (INDEPENDENT_AMBULATORY_CARE_PROVIDER_SITE_OTHER)
Admission: RE | Admit: 2013-09-10 | Discharge: 2013-09-10 | Disposition: A | Discharge status: Home / Self Care | Payer: Medicare Other | Source: Ambulatory Visit | Attending: Internal Medicine | Admitting: Internal Medicine

## 2013-09-10 VITALS — BP 114/76 | HR 61 | Temp 97.6°F

## 2013-09-10 DIAGNOSIS — J398 Other specified diseases of upper respiratory tract: Secondary | ICD-10-CM

## 2013-09-10 DIAGNOSIS — I428 Other cardiomyopathies: Secondary | ICD-10-CM

## 2013-09-10 DIAGNOSIS — R0609 Other forms of dyspnea: Secondary | ICD-10-CM

## 2013-09-10 DIAGNOSIS — R0600 Dyspnea, unspecified: Secondary | ICD-10-CM

## 2013-09-10 LAB — BASIC METABOLIC PANEL
CO2: 27 mEq/L (ref 19–32)
Calcium: 8.8 mg/dL (ref 8.4–10.5)
Chloride: 102 mEq/L (ref 96–112)
Potassium: 4.3 mEq/L (ref 3.5–5.1)
Sodium: 137 mEq/L (ref 135–145)

## 2013-09-10 LAB — CBC WITH DIFFERENTIAL/PLATELET
Basophils Relative: 0.2 % (ref 0.0–3.0)
Eosinophils Relative: 2.1 % (ref 0.0–5.0)
Hemoglobin: 12.5 g/dL (ref 12.0–15.0)
Lymphocytes Relative: 27 % (ref 12.0–46.0)
MCV: 84.8 fl (ref 78.0–100.0)
Monocytes Absolute: 0.6 10*3/uL (ref 0.1–1.0)
Neutrophils Relative %: 64.1 % (ref 43.0–77.0)
RBC: 4.48 Mil/uL (ref 3.87–5.11)
WBC: 9 10*3/uL (ref 4.5–10.5)

## 2013-09-10 MED ORDER — OMEPRAZOLE 20 MG PO CPDR: 20.0000 mg | DELAYED_RELEASE_CAPSULE | Freq: Every day | ORAL | Status: AC

## 2013-09-10 NOTE — Assessment & Plan Note (Signed)
DDX of  difficult airways managment all start with A and  include Adherence, Ace Inhibitors, Acid Reflux, Active Sinus Disease, Alpha 1 Antitripsin deficiency, Anxiety masquerading as Airways dz,  ABPA,  allergy(esp in young), Aspiration (esp in elderly), Adverse effects of DPI,  Active smokers, plus two Bs  = Bronchiectasis and Beta blocker use..and one C= CHF  cxr and bnp strongly point to chf > see cardiomyopathy a/p  ? Acid (or non-acid) GERD > always difficult to exclude as up to 75% of pts in some series report no assoc GI/ Heartburn symptoms> rec max (24h)  acid suppression and diet restrictions/ reviewed and instructions given in writing.   Pulmonary f/u is prn

## 2013-09-10 NOTE — Patient Instructions (Addendum)
For cough the best medication is delsym 2 tsp every 12 hours as needed  For breathless > try albuterol 2 puffs up to 4 hours   Try prilosec 20mg   Take 30-60 min before first meal of the day and Pepcid 20 mg one bedtime until cough is completely gone for at least a week without the need for cough suppression  GERD (REFLUX)  is an extremely common cause of respiratory symptoms, many times with no significant heartburn at all.    It can be treated with medication, but also with lifestyle changes including avoidance of late meals, excessive alcohol, smoking cessation, and avoid fatty foods, chocolate, peppermint, colas, red wine, and acidic juices such as orange juice.  NO MINT OR MENTHOL PRODUCTS SO NO COUGH DROPS  USE SUGARLESS CANDY INSTEAD (jolley ranchers or Stover's or life safers)  NO OIL BASED VITAMINS - use powdered substitutes.   If getting worse go ER  Late add: Add avapro 75 mg daily and double lasix to 20 mg twice daily and f/u in 2weeks   with Dr Tyson Dense as this does not appear to be a lung problem.

## 2013-09-10 NOTE — Progress Notes (Signed)
   Subjective:    Patient ID: [name], female    DOB: 1929/04/26    MRN: [id number]  HPI  37 yobf never smoker with h/o chf referred by Dr Redmond School for eval of sob and abn cxr with dev trachea.  09/10/2013 1st [facility] office visit/ Wert cc new onset cough x 2 weeks prod of clear mucus no fever assoc with new sob transfer from  chair to chair . Assoc with overt hb on prn ppi    No assoc orthopnea/ pnd    Has used saba in past but not with this episode   No obvious day to day or daytime variabilty or assoc   cp or chest tightness, subjective wheeze overt sinus   symptoms. No unusual exp hx or h/o childhood pna/ asthma or knowledge of premature birth.  Sleeping ok without nocturnal  or early am exacerbation  of respiratory  c/o's or need for noct saba. Also denies any obvious fluctuation of symptoms with weather or environmental changes or other aggravating or alleviating factors except as outlined above   Current Medications, Allergies, Complete Past Medical History, Past Surgical History, Family History, and Social History were reviewed in [employer] record.         Review of Systems  Constitutional: Negative for fever, chills and unexpected weight change.  HENT: Negative for congestion, dental problem, ear pain, nosebleeds, postnasal drip, rhinorrhea, sinus pressure, sneezing, sore throat, trouble swallowing and voice change.   Eyes: Negative for visual disturbance.  Respiratory: Positive for cough and shortness of breath. Negative for choking.   Cardiovascular: Negative for chest pain and leg swelling.  Gastrointestinal: Negative for vomiting, abdominal pain and diarrhea.  Genitourinary: Negative for difficulty urinating.  Musculoskeletal: Negative for arthralgias.  Skin: Negative for rash.  Neurological: Negative for tremors, syncope and headaches.  Hematological: Does not bruise/bleed easily.       Objective:   Physical Exam  Elderly  bf nad in w/c with bilateral bka  Wt Readings from Last 3 Encounters:  08/20/09 159 lb (72.122 kg)  08/18/09 162 lb (73.483 kg)     HEENT: nl dentition, turbinates, and orophanx. Nl external ear canals without cough reflex   NECK :  without JVD/Nodes/TM/ nl carotid upstrokes bilaterally   LUNGS: no acc muscle use, clear to A and P bilaterally without cough on insp or exp maneuvers   CV:  RRR  no s3 or murmur or increase in P2, no edema   ABD:  soft and nontender  No bruits or organomegaly, bowel sounds nl  MS:  warm with bilateral BKA's  SKIN: warm and dry without lesions    NEURO:  alert, approp, no deficits    CXR  09/10/2013 :  Congestive heart failure with pulmonary interstitial edema and bilateral pleural effusions.  .      Assessment & Plan:

## 2013-09-10 NOTE — Assessment & Plan Note (Signed)
May have a substernal goiter  But no indication at all of upper airway compromise and appears quite chronic by comparison cxrs dating back 3 years so would not pursue further w/u unless upper airway wheezing becomes apparent.

## 2013-09-10 NOTE — Assessment & Plan Note (Addendum)
Echo 06/2009 1. Left ventricle: The cavity size was normal. Wall thickness was increased in a pattern of mild LVH. Systolic function was severely reduced. The estimated ejection fraction was in the range of 25% to 30%. Diffuse hypokinesis. Doppler parameters are consistent with high ventricular filling pressure. 2. Aortic valve: Mild regurgitation. 3. Mitral valve: Calcified annulus. Severe regurgitation. 4. Left atrium: The atrium was moderately dilated. 5. Right ventricle: Systolic function was mildly to moderately reduced. 6. Tricuspid valve: Moderate regurgitation. 7. Pulmonary arteries: Systolic pressure was moderately increased. PA peak pressure: 57mm Hg (S).   cxr c/w worse edema/ effusions > ? Cardiac asthma > rx prn saba and double lasix/ add avapro 75 mg daily and f/u Dr Tyson Dense

## 2013-09-11 NOTE — Progress Notes (Signed)
Quick Note:  Spoke with pt's caregiver and notified of results per Dr. Wert. Pt verbalized understanding and denied any questions.  ______ 

## 2013-09-11 NOTE — Progress Notes (Signed)
Quick Note:  Spoke with pt's caregiver and notified of results per Dr. Sherene Sires. Pt verbalized understanding and denied any questions.  ______

## 2013-09-12 ENCOUNTER — Telehealth: Payer: Self-pay | Admitting: Internal Medicine

## 2013-09-12 MED ORDER — IRBESARTAN 75 MG PO TABS: 75.0000 mg | ORAL_TABLET | Freq: Every day | ORAL | Status: DC

## 2013-09-12 NOTE — Telephone Encounter (Signed)
Pt's daughter Octavio Graves calling again with some questions/concerns Spoke with [place] who reported that Dr Tyson Dense is not pt's PCP any longer -- she now sees Dr Redmond School.  Apologized to Fowler for the typo and advised that Dr Redmond School is the physician that we have on file for pt.  Also advised Octavio Graves will do an addendum to the cxr noting this change.  Octavio Graves also asking for copy of ov note, cxr and labs be faxed to pt's skilled nursing facility (she could not recall the name) at [phone] [facility].  Advised Octavio Graves this will be taken care of.   Nothing further needed; will sign off.

## 2013-09-12 NOTE — Telephone Encounter (Signed)
Rx for avapro was sent [facility] to let them know this was done

## 2013-09-12 NOTE — Progress Notes (Signed)
Quick Note:  Per 12.24.14 phone note, pt's PCP is Dr Redmond School rather than Dr Tyson Dense.  ______

## 2014-09-14 ENCOUNTER — Other Ambulatory Visit: Payer: Self-pay | Admitting: Internal Medicine

## 2014-09-17 ENCOUNTER — Emergency Department (HOSPITAL_COMMUNITY): Payer: Medicare Other

## 2014-09-17 ENCOUNTER — Inpatient Hospital Stay (HOSPITAL_COMMUNITY)
Admission: EM | Admit: 2014-09-17 | Discharge: 2014-09-21 | DRG: 292 | Disposition: A | Discharge status: Home Health | Payer: Medicare Other | Attending: Internal Medicine | Admitting: Internal Medicine

## 2014-09-17 ENCOUNTER — Encounter (HOSPITAL_COMMUNITY): Payer: Self-pay | Admitting: Emergency Medicine

## 2014-09-17 DIAGNOSIS — Z89511 Acquired absence of right leg below knee: Secondary | ICD-10-CM

## 2014-09-17 DIAGNOSIS — Z794 Long term (current) use of insulin: Secondary | ICD-10-CM

## 2014-09-17 DIAGNOSIS — Z7982 Long term (current) use of aspirin: Secondary | ICD-10-CM | POA: Diagnosis not present

## 2014-09-17 DIAGNOSIS — Z89512 Acquired absence of left leg below knee: Secondary | ICD-10-CM | POA: Diagnosis not present

## 2014-09-17 DIAGNOSIS — I739 Peripheral vascular disease, unspecified: Secondary | ICD-10-CM | POA: Diagnosis present

## 2014-09-17 DIAGNOSIS — E11649 Type 2 diabetes mellitus with hypoglycemia without coma: Secondary | ICD-10-CM | POA: Diagnosis present

## 2014-09-17 DIAGNOSIS — R4781 Slurred speech: Secondary | ICD-10-CM | POA: Diagnosis present

## 2014-09-17 DIAGNOSIS — E039 Hypothyroidism, unspecified: Secondary | ICD-10-CM | POA: Diagnosis present

## 2014-09-17 DIAGNOSIS — R001 Bradycardia, unspecified: Secondary | ICD-10-CM | POA: Diagnosis present

## 2014-09-17 DIAGNOSIS — I129 Hypertensive chronic kidney disease with stage 1 through stage 4 chronic kidney disease, or unspecified chronic kidney disease: Secondary | ICD-10-CM | POA: Diagnosis present

## 2014-09-17 DIAGNOSIS — R059 Cough, unspecified: Secondary | ICD-10-CM

## 2014-09-17 DIAGNOSIS — I429 Cardiomyopathy, unspecified: Secondary | ICD-10-CM | POA: Diagnosis present

## 2014-09-17 DIAGNOSIS — I5023 Acute on chronic systolic (congestive) heart failure: Principal | ICD-10-CM | POA: Diagnosis present

## 2014-09-17 DIAGNOSIS — R42 Dizziness and giddiness: Secondary | ICD-10-CM

## 2014-09-17 DIAGNOSIS — N179 Acute kidney failure, unspecified: Secondary | ICD-10-CM | POA: Diagnosis present

## 2014-09-17 DIAGNOSIS — F419 Anxiety disorder, unspecified: Secondary | ICD-10-CM | POA: Diagnosis present

## 2014-09-17 DIAGNOSIS — E1165 Type 2 diabetes mellitus with hyperglycemia: Secondary | ICD-10-CM | POA: Diagnosis present

## 2014-09-17 DIAGNOSIS — F329 Major depressive disorder, single episode, unspecified: Secondary | ICD-10-CM | POA: Diagnosis present

## 2014-09-17 DIAGNOSIS — I5021 Acute systolic (congestive) heart failure: Secondary | ICD-10-CM

## 2014-09-17 DIAGNOSIS — Z79899 Other long term (current) drug therapy: Secondary | ICD-10-CM | POA: Diagnosis not present

## 2014-09-17 DIAGNOSIS — N189 Chronic kidney disease, unspecified: Secondary | ICD-10-CM | POA: Diagnosis present

## 2014-09-17 DIAGNOSIS — R531 Weakness: Secondary | ICD-10-CM | POA: Diagnosis present

## 2014-09-17 DIAGNOSIS — R05 Cough: Secondary | ICD-10-CM

## 2014-09-17 DIAGNOSIS — F0390 Unspecified dementia without behavioral disturbance: Secondary | ICD-10-CM | POA: Diagnosis present

## 2014-09-17 DIAGNOSIS — I509 Heart failure, unspecified: Secondary | ICD-10-CM

## 2014-09-17 DIAGNOSIS — IMO0002 Reserved for concepts with insufficient information to code with codable children: Secondary | ICD-10-CM | POA: Diagnosis present

## 2014-09-17 LAB — BASIC METABOLIC PANEL
Anion gap: 7 (ref 5–15)
BUN: 26 mg/dL — ABNORMAL HIGH (ref 6–23)
CO2: 27 mmol/L (ref 19–32)
Calcium: 9 mg/dL (ref 8.4–10.5)
Chloride: 96 mEq/L (ref 96–112)
Creatinine, Ser: 1.63 mg/dL — ABNORMAL HIGH (ref 0.50–1.10)
GFR calc Af Amer: 33 mL/min — ABNORMAL LOW (ref >90)
GFR calc non Af Amer: 28 mL/min — ABNORMAL LOW (ref >90)
Glucose, Bld: 279 mg/dL — ABNORMAL HIGH (ref 70–99)
Potassium: 4.9 mmol/L (ref 3.5–5.1)
Sodium: 130 mmol/L — ABNORMAL LOW (ref 135–145)

## 2014-09-17 LAB — CBC
HCT: 44.5 % (ref 36.0–46.0)
HEMOGLOBIN: 14.2 g/dL (ref 12.0–15.0)
MCH: 28.5 pg (ref 26.0–34.0)
MCHC: 31.9 g/dL (ref 30.0–36.0)
MCV: 89.2 fL (ref 78.0–100.0)
PLATELETS: 162 10*3/uL (ref 150–400)
RBC: 4.99 MIL/uL (ref 3.87–5.11)
RDW: 15.8 % — ABNORMAL HIGH (ref 11.5–15.5)
WBC: 5.8 10*3/uL (ref 4.0–10.5)

## 2014-09-17 LAB — BRAIN NATRIURETIC PEPTIDE: B Natriuretic Peptide: 1378.7 pg/mL — ABNORMAL HIGH (ref 0.0–100.0)

## 2014-09-17 LAB — I-STAT TROPONIN, ED: Troponin i, poc: 0.04 ng/mL (ref 0.00–0.08)

## 2014-09-17 LAB — CBG MONITORING, ED: Glucose-Capillary: 268 mg/dL — ABNORMAL HIGH (ref 70–99)

## 2014-09-17 MED ORDER — HYDRALAZINE HCL 10 MG PO TABS
10.0000 mg | ORAL_TABLET | Freq: Three times a day (TID) | ORAL | Status: DC
  Administered 2014-09-18 – 2014-09-21 (×11): 10 mg via ORAL
  Filled 2014-09-17 (×14): qty 1

## 2014-09-17 MED ORDER — ALBUTEROL SULFATE (2.5 MG/3ML) 0.083% IN NEBU: 2.5000 mg | INHALATION_SOLUTION | Freq: Four times a day (QID) | RESPIRATORY_TRACT | Status: DC | PRN

## 2014-09-17 MED ORDER — ENOXAPARIN SODIUM 40 MG/0.4ML ~~LOC~~ SOLN
40.0000 mg | Freq: Every day | SUBCUTANEOUS | Status: DC
  Administered 2014-09-17: 40 mg via SUBCUTANEOUS
  Filled 2014-09-17: qty 0.4

## 2014-09-17 MED ORDER — LEVOTHYROXINE SODIUM 75 MCG PO TABS
75.0000 ug | ORAL_TABLET | Freq: Every day | ORAL | Status: DC
  Administered 2014-09-18 – 2014-09-21 (×4): 75 ug via ORAL
  Filled 2014-09-17 (×5): qty 1

## 2014-09-17 MED ORDER — ASPIRIN EC 325 MG PO TBEC: 325.0000 mg | DELAYED_RELEASE_TABLET | Freq: Every day | ORAL | Status: DC

## 2014-09-17 MED ORDER — PANTOPRAZOLE SODIUM 40 MG PO TBEC
40.0000 mg | DELAYED_RELEASE_TABLET | Freq: Every day | ORAL | Status: DC
  Administered 2014-09-18 – 2014-09-21 (×4): 40 mg via ORAL
  Filled 2014-09-17 (×5): qty 1

## 2014-09-17 MED ORDER — INSULIN GLARGINE 100 UNIT/ML ~~LOC~~ SOLN
37.0000 [IU] | Freq: Every day | SUBCUTANEOUS | Status: DC
  Filled 2014-09-17: qty 0.37

## 2014-09-17 MED ORDER — ASPIRIN 81 MG PO CHEW: 81.0000 mg | CHEWABLE_TABLET | Freq: Every day | ORAL | Status: DC

## 2014-09-17 MED ORDER — SODIUM CHLORIDE 0.9 % IJ SOLN
3.0000 mL | Freq: Two times a day (BID) | INTRAMUSCULAR | Status: DC
  Administered 2014-09-18 – 2014-09-20 (×5): 3 mL via INTRAVENOUS

## 2014-09-17 MED ORDER — ASPIRIN 325 MG PO TABS
325.0000 mg | ORAL_TABLET | Freq: Every day | ORAL | Status: DC
  Administered 2014-09-18 – 2014-09-21 (×4): 325 mg via ORAL
  Filled 2014-09-17 (×5): qty 1

## 2014-09-17 MED ORDER — GLUCAGON HCL RDNA (DIAGNOSTIC) 1 MG IJ SOLR
1.0000 mg | Freq: Once | INTRAMUSCULAR | Status: AC
  Administered 2014-09-17: 1 mg via INTRAVENOUS
  Filled 2014-09-17: qty 1

## 2014-09-17 MED ORDER — FUROSEMIDE 10 MG/ML IJ SOLN
40.0000 mg | Freq: Once | INTRAMUSCULAR | Status: AC
  Administered 2014-09-17: 40 mg via INTRAVENOUS
  Filled 2014-09-17: qty 4

## 2014-09-17 MED ORDER — INSULIN ASPART 100 UNIT/ML ~~LOC~~ SOLN
0.0000 [IU] | Freq: Three times a day (TID) | SUBCUTANEOUS | Status: DC
  Administered 2014-09-18: 1 [IU] via SUBCUTANEOUS
  Administered 2014-09-18: 2 [IU] via SUBCUTANEOUS
  Administered 2014-09-19: 1 [IU] via SUBCUTANEOUS
  Administered 2014-09-20: 7 [IU] via SUBCUTANEOUS

## 2014-09-17 MED ORDER — ACETAMINOPHEN 325 MG PO TABS: 650.0000 mg | ORAL_TABLET | ORAL | Status: DC | PRN

## 2014-09-17 MED ORDER — ONDANSETRON 4 MG PO TBDP
4.0000 mg | ORAL_TABLET | Freq: Three times a day (TID) | ORAL | Status: DC | PRN
  Filled 2014-09-17: qty 1

## 2014-09-17 NOTE — ED Notes (Addendum)
Admitting MD at bedside.

## 2014-09-17 NOTE — ED Provider Notes (Signed)
CSN: [id number]     Arrival date & time 09/17/14  1811 History   First MD Initiated Contact with Patient 09/17/14 1833     Chief Complaint  Patient presents with  . Weakness  . Dizziness     (Consider location/radiation/quality/duration/timing/severity/associated sxs/prior Treatment) Patient is a 79 y.o. female presenting with weakness and dizziness. The history is provided by the patient and a relative.  Weakness This is a new problem. The current episode started more than 1 week ago. The problem occurs daily. The problem has not changed since onset.Pertinent negatives include no chest pain, no abdominal pain, no headaches and no shortness of breath. Nothing aggravates the symptoms. Nothing relieves the symptoms. She has tried nothing for the symptoms.  Dizziness Associated symptoms: no chest pain, no headaches, no shortness of breath and no vomiting     Past Medical History  Diagnosis Date  . Systolic CHF, acute   . Cardiomyopathy     presumed nonischemic  . PVD (peripheral vascular disease)   . HTN (hypertension)   . DM2 (diabetes mellitus, type 2)   . Hypothyroidism   . Dementia   . Anxiety and depression   . Hypokalemia   . Acute osteomyelitis, ankle and foot   . Diabetic Charcot foot     history of   Past Surgical History  Procedure Laterality Date  . Enucleation  1972  . Rectal polypectomy     Family History  Problem Relation Age of Onset  . Diabetes Mother   . Alzheimer's disease Mother   . Diabetes Father   . Alzheimer's disease Father    History  Substance Use Topics  . Smoking status: Never Smoker   . Smokeless tobacco: Never Used  . Alcohol Use: No     Comment: quit in 1982    OB History    No data available     Review of Systems  Constitutional: Negative for fever.  Respiratory: Negative for cough and shortness of breath.   Cardiovascular: Negative for chest pain.  Gastrointestinal: Negative for vomiting and abdominal pain.  Neurological:  Positive for dizziness and weakness. Negative for headaches.  All other systems reviewed and are negative.     Allergies  Review of patient's allergies indicates no known allergies.  Home Medications   Prior to Admission medications   Medication Sig Start Date End Date Taking? Authorizing Provider  albuterol (VENTOLIN HFA) 108 (90 BASE) MCG/ACT inhaler Inhale 2 puffs into the lungs every 6 (six) hours as needed for wheezing or shortness of breath.   Yes Historical Provider, MD  aspirin 81 MG chewable tablet Chew 81 mg by mouth daily.   Yes Historical Provider, MD  carvedilol (COREG) 3.125 MG tablet Take 3.125 mg by mouth 2 (two) times daily with a meal.   Yes Historical Provider, MD  furosemide (LASIX) 20 MG tablet Take 20 mg by mouth daily.   Yes Historical Provider, MD  hydrALAZINE (APRESOLINE) 10 MG tablet Take 10 mg by mouth 3 (three) times daily.   Yes Historical Provider, MD  insulin glargine (LANTUS) 100 UNIT/ML injection Inject 37 Units into the skin at bedtime.    Yes Historical Provider, MD  insulin lispro (HUMALOG) 100 UNIT/ML injection Inject 9 Units into the skin 3 (three) times daily with meals. Use as directed   Yes Historical Provider, MD  irbesartan (AVAPRO) 75 MG tablet Take 75 mg by mouth daily.   Yes Historical Provider, MD  levothyroxine (SYNTHROID, LEVOTHROID) 75 MCG tablet Take 75  mcg by mouth daily before breakfast.   Yes Historical Provider, MD  metFORMIN (GLUCOPHAGE) 1000 MG tablet Take 500 mg by mouth 2 (two) times daily with a meal.    Yes Historical Provider, MD  omeprazole (PRILOSEC) 20 MG capsule Take 1 capsule (20 mg total) by mouth daily. 09/10/13  Yes Nyoka CowdenMichael B Wert, MD  ondansetron (ZOFRAN-ODT) 4 MG disintegrating tablet Take 4 mg by mouth every 8 (eight) hours as needed for nausea or vomiting.   Yes Historical Provider, MD  irbesartan (AVAPRO) 75 MG tablet TAKE 1 TABLET BY MOUTH EVERY DAY Patient not taking: Reported on 09/17/2014 09/16/14   Nyoka CowdenMichael B  Wert, MD   BP 139/60 mmHg  Pulse 43  Temp(Src) 98.2 F (36.8 C) (Oral)  Resp 21  SpO2 97% Physical Exam  Constitutional: She is oriented to person, place, and time. She appears well-developed and well-nourished. No distress.  HENT:  Head: Normocephalic and atraumatic.  Mouth/Throat: Oropharynx is clear and moist.  Eyes: EOM are normal. Pupils are equal, round, and reactive to light.  Neck: Normal range of motion. Neck supple.  Cardiovascular: Normal rate and regular rhythm.  Exam reveals no friction rub.   No murmur heard. Pulmonary/Chest: Effort normal and breath sounds normal. No respiratory distress. She has no wheezes. She has no rales.  Abdominal: Soft. She exhibits no distension. There is no tenderness. There is no rebound.  Musculoskeletal: Normal range of motion. She exhibits no edema.  Neurological: She is alert and oriented to person, place, and time. No cranial nerve deficit. She exhibits normal muscle tone. Coordination normal.  Skin: No rash noted. She is not diaphoretic.  Nursing note and vitals reviewed.   ED Course  Procedures (including critical care time) Labs Review Labs Reviewed  CBC - Abnormal; Notable for the following:    RDW 15.8 (*)    All other components within normal limits  BASIC METABOLIC PANEL - Abnormal; Notable for the following:    Sodium 130 (*)    Glucose, Bld 279 (*)    BUN 26 (*)    Creatinine, Ser 1.63 (*)    GFR calc non Af Amer 28 (*)    GFR calc Af Amer 33 (*)    All other components within normal limits  BRAIN NATRIURETIC PEPTIDE - Abnormal; Notable for the following:    B Natriuretic Peptide 1378.7 (*)    All other components within normal limits  CBG MONITORING, ED - Abnormal; Notable for the following:    Glucose-Capillary 268 (*)    All other components within normal limits  URINALYSIS, ROUTINE W REFLEX MICROSCOPIC  I-STAT TROPOININ, ED    Imaging Review Dg Chest 2 View  09/17/2014   CLINICAL DATA:  Cough.  Shortness  of breath.  EXAM: CHEST  2 VIEW  COMPARISON:  Single frontal view 09/10/2013  FINDINGS: Cardiomegaly is again seen. There are bilateral pleural effusions and associated bibasilar airspace disease. This appears similar to prior exam. Minimal fluid is seen in the right minor fissure. There is vascular congestion and mild pulmonary edema.  IMPRESSION: Findings consistent with congestive heart failure.   Electronically Signed   By: Rubye OaksMelanie  Ehinger M.D.   On: 09/17/2014 19:29     EKG Interpretation   Date/Time:  Tuesday September 17 2014 18:29:41 EST Ventricular Rate:  49 PR Interval:  243 QRS Duration: 152 QT Interval:  517 QTC Calculation: 467 R Axis:   127 Text Interpretation:  Sinus bradycardia Prolonged PR interval Right bundle  branch  block - new RBBB new, otherwise morphologies similar Confirmed by  Gwendolyn GrantWALDEN  MD, Rether Rison ([id number]) on 09/17/2014 6:35:04 PM      MDM   Final diagnoses:  Dizziness  CHF exacerbation    50F here with dizziness, weakness. Per family, decreased PO intake and weak. Afebrile, bradycardic. Normotensive. Patient states she's feeling well at this time. 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 Lungs clear. Belly benign.  Glucagon given in case patient's HR was low secondary to beta-blocker overdose. Labs show BNP elevation. Plan for admission. Lasix given.     Elwin MochaBlair Ahonesty Woodfin, MD 09/17/14 2249

## 2014-09-17 NOTE — H&P (Signed)
[facility] History and Physical  [name] ZOX:[id number] DOB: Mar 27, 1929 DOA: 09/17/2014  Referring physician: ER physician. PCP: Florentina Jenny, MD   History obtained from ER physician and patient's daughter.  Chief Complaint: Weakness.  HPI: [name] is a 80 y.o. female with history of chronic systolic heart failure last year measured was 25-30% in 2010, hypertension, diabetes mellitus type 2, neurovascular disease status post bilateral BKA, dementia, hypothyroidism was brought to the ER after patient was found to be increasingly weak and also was found to have slurred speech. As per patient's daughter patient's symptoms started a week ago with weakness and patient at times complaining of dizziness patient also was found to have slurred speech at that time but in a couple of days patient's symptoms resolved and got better. Patient's symptoms started recurring again today and is at this time patient is brought to the ER. The ER CT head did not show anything acute. EKG shows sinus bradycardia. Patient's chest x-ray shows congestive pattern with BNP markedly elevated. Patient was given a dose of Lasix IV and for patient's bradycardia one dose of glucagon was given which did not show much response. Patient will be admitted for further management. On exam patient is mildly lethargic but follows commands and answers appropriately. Denies any nausea vomiting chest pain or or any abdominal pain or shortness of breath. Denies any productive cough fever chills.   Review of Systems: As presented in the history of presenting illness, rest negative.  Past Medical History  Diagnosis Date  . Systolic CHF, acute   . Cardiomyopathy     presumed nonischemic  . PVD (peripheral vascular disease)   . HTN (hypertension)   . DM2 (diabetes mellitus, type 2)   . Hypothyroidism   . Dementia   . Anxiety and depression   . Hypokalemia   . Acute osteomyelitis, ankle and foot   . Diabetic  Charcot foot     history of   Past Surgical History  Procedure Laterality Date  . Enucleation  1972  . Rectal polypectomy    . Amputation lower extremities     Social History:  reports that she has never smoked. She has never used smokeless tobacco. She reports that she does not drink alcohol or use illicit drugs. Where does patient live home. Can patient participate in ADLs? Not sure.  No Known Allergies  Family History:  Family History  Problem Relation Age of Onset  . Diabetes Mother   . Alzheimer's disease Mother   . Diabetes Father   . Alzheimer's disease Father       Prior to Admission medications   Medication Sig Start Date End Date Taking? Authorizing Provider  albuterol (VENTOLIN HFA) 108 (90 BASE) MCG/ACT inhaler Inhale 2 puffs into the lungs every 6 (six) hours as needed for wheezing or shortness of breath.   Yes Historical Provider, MD  aspirin 81 MG chewable tablet Chew 81 mg by mouth daily.   Yes Historical Provider, MD  carvedilol (COREG) 3.125 MG tablet Take 3.125 mg by mouth 2 (two) times daily with a meal.   Yes Historical Provider, MD  furosemide (LASIX) 20 MG tablet Take 20 mg by mouth daily.   Yes Historical Provider, MD  hydrALAZINE (APRESOLINE) 10 MG tablet Take 10 mg by mouth 3 (three) times daily.   Yes Historical Provider, MD  insulin glargine (LANTUS) 100 UNIT/ML injection Inject 37 Units into the skin at bedtime.    Yes Historical Provider, MD  insulin  lispro (HUMALOG) 100 UNIT/ML injection Inject 9 Units into the skin 3 (three) times daily with meals. Use as directed   Yes Historical Provider, MD  irbesartan (AVAPRO) 75 MG tablet Take 75 mg by mouth daily.   Yes Historical Provider, MD  levothyroxine (SYNTHROID, LEVOTHROID) 75 MCG tablet Take 75 mcg by mouth daily before breakfast.   Yes Historical Provider, MD  metFORMIN (GLUCOPHAGE) 1000 MG tablet Take 500 mg by mouth 2 (two) times daily with a meal.    Yes Historical Provider, MD  omeprazole  (PRILOSEC) 20 MG capsule Take 1 capsule (20 mg total) by mouth daily. 09/10/13  Yes Nyoka CowdenMichael B Wert, MD  ondansetron (ZOFRAN-ODT) 4 MG disintegrating tablet Take 4 mg by mouth every 8 (eight) hours as needed for nausea or vomiting.   Yes Historical Provider, MD  irbesartan (AVAPRO) 75 MG tablet TAKE 1 TABLET BY MOUTH EVERY DAY Patient not taking: Reported on 09/17/2014 09/16/14   Nyoka CowdenMichael B Wert, MD    Physical Exam: Filed Vitals:   09/17/14 1830 09/17/14 2211  BP: 139/60 126/57  Pulse: 43 51  Temp: 98.2 F (36.8 C)   TempSrc: Oral   Resp: 21 16  SpO2: 97% 96%     General:  Moderately built and nourished.  Eyes: Anicteric no pallor.  ENT: No discharge from the ears eyes nose or mouth.  Neck: No mass felt. No JVD appreciated.  Cardiovascular: S1-S2 heard.  Respiratory: No rhonchi or crepitations.  Abdomen: Soft nontender bowel sounds present.  Skin: No obvious rash.  Musculoskeletal: Bilateral BKA.  Psychiatric: Patient is lethargic but answers questions appropriately.  Neurologic: Lethargic but arousable and oriented to her name and is able to recognize her daughter. Moves all extremities. Patient does have slurred speech.  Labs on Admission:  Basic Metabolic Panel:  Recent Labs Lab 09/17/14 1925  NA 130*  K 4.9  CL 96  CO2 27  GLUCOSE 279*  BUN 26*  CREATININE 1.63*  CALCIUM 9.0   Liver Function Tests: No results for input(s): AST, ALT, ALKPHOS, BILITOT, PROT, ALBUMIN in the last 168 hours. No results for input(s): LIPASE, AMYLASE in the last 168 hours. No results for input(s): AMMONIA in the last 168 hours. CBC:  Recent Labs Lab 09/17/14 1925  WBC 5.8  HGB 14.2  HCT 44.5  MCV 89.2  PLT 162   Cardiac Enzymes: No results for input(s): CKTOTAL, CKMB, CKMBINDEX, TROPONINI in the last 168 hours.  BNP (last 3 results) No results for input(s): PROBNP in the last 8760 hours. CBG:  Recent Labs Lab 09/17/14 1937  GLUCAP 268*    Radiological  Exams on Admission: Dg Chest 2 View  09/17/2014   CLINICAL DATA:  Cough.  Shortness of breath.  EXAM: CHEST  2 VIEW  COMPARISON:  Single frontal view 09/10/2013  FINDINGS: Cardiomegaly is again seen. There are bilateral pleural effusions and associated bibasilar airspace disease. This appears similar to prior exam. Minimal fluid is seen in the right minor fissure. There is vascular congestion and mild pulmonary edema.  IMPRESSION: Findings consistent with congestive heart failure.   Electronically Signed   By: Rubye OaksMelanie  Ehinger M.D.   On: 09/17/2014 19:29   Ct Head Wo Contrast  09/17/2014   CLINICAL DATA:  Progressive weakness with dizziness and memory loss.  EXAM: CT HEAD WITHOUT CONTRAST  TECHNIQUE: Contiguous axial images were obtained from the base of the skull through the vertex without intravenous contrast.  COMPARISON:  10/29/2010  FINDINGS: No mass lesion. No midline  shift. No acute hemorrhage or hematoma. No extra-axial fluid collections. No evidence of acute infarction. There is severe diffuse cerebral cortical and cerebellar atrophy, most severe in the temporal and frontal lobes with secondary ventricular dilatation. There is fairly extensive periventricular white matter lucency consistent with chronic small vessel ischemic disease. Left globe prosthesis. No osseous abnormality.  IMPRESSION: No acute abnormality. Severe diffuse atrophy. Chronic small vessel ischemic changes.   Electronically Signed   By: Geanie CooleyJim  Maxwell M.D.   On: 09/17/2014 21:05    EKG: Independently reviewed. Sinus bradycardia with RBBB.  Assessment/Plan Principal Problem:   Weakness Active Problems:   CHF (congestive heart failure)   Slurred speech   Acute renal failure   Diabetes mellitus type 2, uncontrolled   Hypothyroidism   Bradycardia   1. Weakness with slurred speech and bradycardia - patient's weakness may be related to bradycardia but given the patient also has slurred speech at this time we have to rule  out stroke by ordering MRI brain. Patient does have stroke and further stroke workup. Until then patient will be on neuro checks. 4 bradycardia at this time I'm holding off patient's Coreg. Check TSH. Closely follow heart rate and rhythm in the monitor. 2. CHF number chronic systolic heart failure last year measured as 25-30% - patient was given 1 dose of Lasix in the ER through IV. Based on the response will have further doses planned accordingly. Check 2-D echo. Cycle cardiac markers. I'm holding off patient's ARB due to acute renal failure. 3. Acute renal failure - holding off ARB due to acute renal failure. Closely follow metabolic panel intake output. Further studies if creatinine does not improve. 4. Hypothyroidism - on Synthroid. Check TSH. 5. Diabetes mellitus type 2 uncontrolled - closely follow CBGs with sliding scale coverage. Check hemoglobin A1c. Patient is on Lantus. Hold metformin due to renal failure. 6. Hypertension - see #2 and 3. Holding Coreg due to bradycardia. Will keep patient on when necessary IV hydralazine for systolic blood pressure more than 200. 7.    DVT Prophylaxis Lovenox.  Code Status: Full code.  Family Communication: Patient's daughter.  Disposition Plan: Admit to inpatient.    Rhyker Silversmith N. [facility] Pager [phone].  If 7PM-7AM, please contact night-coverage [email or web address] Password [email or web address] 09/17/2014, 10:54 PM

## 2014-09-17 NOTE — ED Notes (Signed)
Pt family states this past Monday pt has become weak having a loss of appetite and c/o dizziness. Denies pain.

## 2014-09-17 NOTE — Progress Notes (Signed)
  CARE MANAGEMENT ED NOTE 09/17/2014  Patient:  [name],[name]   Account Number:  [id number]  Date Initiated:  09/17/2014  Documentation initiated by:  Radford PaxFERRERO,Jayvian Escoe  Subjective/Objective Assessment:   Patient presents to Ed with weakness, loss of appetite and dizziness     Subjective/Objective Assessment Detail:   Patient with pmhx of chf, HTN, hypothyroid, dementia, DM2, PVD, bilateral bka     Action/Plan:   Action/Plan Detail:   Anticipated DC Date:       Status Recommendation to Physician:   Result of Recommendation:    Other ED Services  Consult Working Plan    DC Planning Services  CM consult  Other    Choice offered to / List presented to:            Status of service:  Completed, signed off  ED Comments:   ED Comments Detail:  EDCM spoke to patient and her daughter Jeananne RamaBonita Benton at bedside.  Bonita's phone number [phone].  Patient lives at home with her daughter.  Patient's daughter reports she provides the patient 24 hour care.  Patient has a hospital bed, hoyer lift, bedside commode, shower chair and wheelchair at home.  Patient does not have any home health services at this time.  Patient's daughter reports patient needs a new mattress on the hospital bed as it is torn at both the top and the bottom. Patient's daughter also reports the patient needs a new bedside commode the one with the let down arms per patient's daughter. Patient's daughter reports [facility]  EDCM provided patient's daughter with list of private duty nursing agencies and [place].  Patient's daughter thankful for resources.  No further EDCM needs at this time.

## 2014-09-17 NOTE — Progress Notes (Signed)
CSW met with patient at bedside. Family was present at bedside. Patient was falling asleep during the interview.  According to daughter, the patient presents to the ED because of CHF. Family states the patient initially started having issues with her heart in February of 2012. Daughter states the patient has been living at home with her since 2004. Daughter informed CSW that things have been going well at home.  Daughter states patient had a CT scan and EKG done tonight in the ED.   Daughter/Bonita Benton [phone]  Willette Brace [phone] ED CSW 09/17/2014 10:32 PM

## 2014-09-18 ENCOUNTER — Inpatient Hospital Stay (HOSPITAL_COMMUNITY): Payer: Medicare Other

## 2014-09-18 DIAGNOSIS — E039 Hypothyroidism, unspecified: Secondary | ICD-10-CM

## 2014-09-18 DIAGNOSIS — N179 Acute kidney failure, unspecified: Secondary | ICD-10-CM

## 2014-09-18 DIAGNOSIS — I359 Nonrheumatic aortic valve disorder, unspecified: Secondary | ICD-10-CM

## 2014-09-18 DIAGNOSIS — R531 Weakness: Secondary | ICD-10-CM

## 2014-09-18 DIAGNOSIS — E118 Type 2 diabetes mellitus with unspecified complications: Secondary | ICD-10-CM

## 2014-09-18 DIAGNOSIS — I5023 Acute on chronic systolic (congestive) heart failure: Principal | ICD-10-CM

## 2014-09-18 LAB — RAPID URINE DRUG SCREEN, HOSP PERFORMED
Amphetamines: NOT DETECTED
Barbiturates: NOT DETECTED
Benzodiazepines: NOT DETECTED
COCAINE: NOT DETECTED
Opiates: NOT DETECTED
Tetrahydrocannabinol: NOT DETECTED

## 2014-09-18 LAB — CBC WITH DIFFERENTIAL/PLATELET
Basophils Absolute: 0 10*3/uL (ref 0.0–0.1)
Basophils Relative: 0 % (ref 0–1)
Eosinophils Absolute: 0.1 10*3/uL (ref 0.0–0.7)
Eosinophils Relative: 2 % (ref 0–5)
HCT: 41.7 % (ref 36.0–46.0)
Hemoglobin: 14 g/dL (ref 12.0–15.0)
Lymphocytes Relative: 31 % (ref 12–46)
Lymphs Abs: 2.2 10*3/uL (ref 0.7–4.0)
MCH: 29.6 pg (ref 26.0–34.0)
MCHC: 33.6 g/dL (ref 30.0–36.0)
MCV: 88.2 fL (ref 78.0–100.0)
Monocytes Absolute: 0.7 10*3/uL (ref 0.1–1.0)
Monocytes Relative: 10 % (ref 3–12)
Neutro Abs: 4.1 10*3/uL (ref 1.7–7.7)
Neutrophils Relative %: 57 % (ref 43–77)
Platelets: 141 10*3/uL — ABNORMAL LOW (ref 150–400)
RBC: 4.73 MIL/uL (ref 3.87–5.11)
RDW: 15.8 % — ABNORMAL HIGH (ref 11.5–15.5)
WBC: 7.2 10*3/uL (ref 4.0–10.5)

## 2014-09-18 LAB — GLUCOSE, CAPILLARY
GLUCOSE-CAPILLARY: 152 mg/dL — AB (ref 70–99)
GLUCOSE-CAPILLARY: 130 mg/dL — AB (ref 70–99)
GLUCOSE-CAPILLARY: 49 mg/dL — AB (ref 70–99)
Glucose-Capillary: 100 mg/dL — ABNORMAL HIGH (ref 70–99)
Glucose-Capillary: 70 mg/dL (ref 70–99)
Glucose-Capillary: 73 mg/dL (ref 70–99)
Glucose-Capillary: 78 mg/dL (ref 70–99)

## 2014-09-18 LAB — LIPID PANEL
Cholesterol: 82 mg/dL (ref 0–200)
HDL: 27 mg/dL — ABNORMAL LOW (ref >39)
LDL Cholesterol: 44 mg/dL (ref 0–99)
Total CHOL/HDL Ratio: 3 RATIO
Triglycerides: 56 mg/dL (ref <150)
VLDL: 11 mg/dL (ref 0–40)

## 2014-09-18 LAB — HEMOGLOBIN A1C
Hgb A1c MFr Bld: 7.7 % — ABNORMAL HIGH (ref <5.7)
MEAN PLASMA GLUCOSE: 174 mg/dL — AB (ref <117)

## 2014-09-18 LAB — COMPREHENSIVE METABOLIC PANEL
ALK PHOS: 128 U/L — AB (ref 39–117)
ALT: 14 U/L (ref 0–35)
AST: 28 U/L (ref 0–37)
Albumin: 2.9 g/dL — ABNORMAL LOW (ref 3.5–5.2)
Anion gap: 11 (ref 5–15)
BUN: 25 mg/dL — AB (ref 6–23)
CO2: 24 mmol/L (ref 19–32)
Calcium: 8.4 mg/dL (ref 8.4–10.5)
Chloride: 93 mEq/L — ABNORMAL LOW (ref 96–112)
Creatinine, Ser: 1.62 mg/dL — ABNORMAL HIGH (ref 0.50–1.10)
GFR calc Af Amer: 33 mL/min — ABNORMAL LOW (ref >90)
GFR calc non Af Amer: 28 mL/min — ABNORMAL LOW (ref >90)
Glucose, Bld: 215 mg/dL — ABNORMAL HIGH (ref 70–99)
POTASSIUM: 4.6 mmol/L (ref 3.5–5.1)
SODIUM: 128 mmol/L — AB (ref 135–145)
TOTAL PROTEIN: 6.5 g/dL (ref 6.0–8.3)
Total Bilirubin: 1.6 mg/dL — ABNORMAL HIGH (ref 0.3–1.2)

## 2014-09-18 LAB — SODIUM, URINE, RANDOM: Sodium, Ur: 64 mEq/L

## 2014-09-18 LAB — URINALYSIS, ROUTINE W REFLEX MICROSCOPIC
Bilirubin Urine: NEGATIVE
GLUCOSE, UA: NEGATIVE mg/dL
Hgb urine dipstick: NEGATIVE
KETONES UR: NEGATIVE mg/dL
Leukocytes, UA: NEGATIVE
Nitrite: NEGATIVE
PROTEIN: NEGATIVE mg/dL
Specific Gravity, Urine: 1.011 (ref 1.005–1.030)
Urobilinogen, UA: 1 mg/dL (ref 0.0–1.0)
pH: 5.5 (ref 5.0–8.0)

## 2014-09-18 LAB — TROPONIN I
TROPONIN I: 0.09 ng/mL — AB (ref <0.031)
Troponin I: 0.08 ng/mL — ABNORMAL HIGH (ref <0.031)
Troponin I: 0.08 ng/mL — ABNORMAL HIGH (ref <0.031)

## 2014-09-18 LAB — TSH: TSH: 1.632 u[IU]/mL (ref 0.350–4.500)

## 2014-09-18 MED ORDER — FUROSEMIDE 10 MG/ML IJ SOLN
80.0000 mg | Freq: Once | INTRAMUSCULAR | Status: AC
  Administered 2014-09-18: 80 mg via INTRAVENOUS
  Filled 2014-09-18: qty 8

## 2014-09-18 MED ORDER — LORAZEPAM 2 MG/ML IJ SOLN: 0.5000 mg | Freq: Once | INTRAMUSCULAR | Status: DC

## 2014-09-18 MED ORDER — ENOXAPARIN SODIUM 30 MG/0.3ML ~~LOC~~ SOLN
30.0000 mg | Freq: Every day | SUBCUTANEOUS | Status: DC
  Administered 2014-09-18 – 2014-09-20 (×3): 30 mg via SUBCUTANEOUS
  Filled 2014-09-18 (×3): qty 0.3

## 2014-09-18 MED ORDER — INSULIN GLARGINE 100 UNIT/ML ~~LOC~~ SOLN
37.0000 [IU] | Freq: Every day | SUBCUTANEOUS | Status: DC
  Administered 2014-09-18: 37 [IU] via SUBCUTANEOUS
  Filled 2014-09-18 (×2): qty 0.37

## 2014-09-18 MED ORDER — ASPIRIN 325 MG PO TABS
325.0000 mg | ORAL_TABLET | Freq: Once | ORAL | Status: AC
  Administered 2014-09-18: 325 mg via ORAL
  Filled 2014-09-18: qty 1

## 2014-09-18 NOTE — Progress Notes (Signed)
  Echocardiogram 2D Echocardiogram has been performed.  [name] 09/18/2014, 9:42 AM

## 2014-09-18 NOTE — Progress Notes (Signed)
CARE MANAGEMENT NOTE 09/18/2014  Patient:  Lucinda DellWHITSETT,Nickol E   Account Number:  [id number]  Date Initiated:  09/18/2014  Documentation initiated by:  Ferdinand CavaSCHETTINO,Yomar Mejorado  Subjective/Objective Assessment:   80 yo female admitted with possible stroke and weakness with slurred speech     Action/Plan:   discharge planning   Anticipated DC Date:  09/19/2014   Anticipated DC Plan:  HOME/SELF CARE      DC Planning Services  CM consult      Choice offered to / List presented to:             Status of service:   Medicare Important Message given?   (If response is "NO", the following Medicare IM given date fields will be blank) Date Medicare IM given:   Medicare IM given by:   Date Additional Medicare IM given:   Additional Medicare IM given by:    Discharge Disposition:    Per UR Regulation:    If discussed at Long Length of Stay Meetings, dates discussed:    Comments:  09/18/14 Ferdinand CavaAndrea Schettino RN BSN CM [phone] [id number] Spoke with patient daughter, Octavio GravesBonita, at the bedside and she stated that she lives with her mother and cares for her 24/7. They have a hospitake bed, hoyer lift, BSC, shower chair, and wc. She would like a new mattess for the bed and a new BSC with drop down handles. This [facility] Lecretia, reagrding eligibility for new mattress and BSC upgrade. Octavio GravesBonita also stated that the patient has had HH services in the past [facility] and likes both [facility]. Will follow up on DME eligibility and any HH needs, orders

## 2014-09-18 NOTE — Progress Notes (Signed)
Patient arrived from ED via stretcher. Patient is awake and oriented to self only, is able to follow commands. Patient able to self transfer from ED stretcher to bed with a lot of repeated direction and encouragement. Patient denies pain, no respiratory difficulty noted. Patient daughter is at bedside at this time.

## 2014-09-18 NOTE — Progress Notes (Signed)
Call from Dr. Toniann FailKakrakandy to discuss patient plan of care. Patient will remain of beta blockers at this time due to HR. Monitor telemetry at this time. Patient will get MRI in AM for confusion and slurred speech. Informed him patient passed stroke swallow screen in ED and will place order for diet. Patient is incontinent of urine at times at home and is receiving IV lasix. Dr. Toniann FailKakrakandy will place order for foley catheter. Will monitor creatinine level with labs. Received order to change time of lantus to Q AM at this is how patient takes at home.

## 2014-09-18 NOTE — Progress Notes (Addendum)
[facility] PROGRESS NOTE  Aimee DellMary E Ruffner FAO:[id number] DOB: 1929-01-04 DOA: 09/17/2014  PCP: Florentina JennyRIPP, HENRY, MD  Brief HPI: 80 year old female with a history of chronic systolic heart failure, hypertension, diabetes, bilateral BKA, dementia, was brought in for weakness and slurred speech.  Past medical history:  Past Medical History  Diagnosis Date  . Systolic CHF, acute   . Cardiomyopathy     presumed nonischemic  . PVD (peripheral vascular disease)   . HTN (hypertension)   . DM2 (diabetes mellitus, type 2)   . Hypothyroidism   . Dementia   . Anxiety and depression   . Hypokalemia   . Acute osteomyelitis, ankle and foot   . Diabetic Charcot foot     history of    Consultants: None yet  Procedures:  2-D echocardiogram is pending  Antibiotics: None  Subjective: Patient somewhat agitated. Denies any chest pain or shortness of breath. Daughter and son-in-law at the bedside.  Objective: Vital Signs  Filed Vitals:   09/17/14 2211 09/17/14 2305 09/18/14 0152 09/18/14 0551  BP: 126/57 138/54 132/57 139/58  Pulse: 51 50 48 50  Temp:  97.4 F (36.3 C)  97.4 F (36.3 C)  TempSrc:  Oral  Oral  Resp: 16 18 16 16   Height:  4\' 7"  (1.397 m)    Weight:  74.2 kg (163 lb 9.3 oz)  73.2 kg (161 lb 6 oz)  SpO2: 96% 98% 97% 98%    Intake/Output Summary (Last 24 hours) at 09/18/14 0955 Last data filed at 09/18/14 0353  Gross per 24 hour  Intake    100 ml  Output    830 ml  Net   -730 ml   Filed Weights   09/17/14 2305 09/18/14 0551  Weight: 74.2 kg (163 lb 9.3 oz) 73.2 kg (161 lb 6 oz)    General appearance: alert, distracted, no distress and uncooperative Head: Normocephalic, without obvious abnormality, atraumatic Resp: Crackles bilateral bases. Dullness to percussion in the bases. No wheezing. No rhonchi. Cardio: regular rate and rhythm, S1, S2 normal, no murmur, click, rub or gallop GI: soft, non-tender; bowel sounds normal; no masses,  no  organomegaly Extremities: She is bilateral BKA Neurologic: She is alert. Somewhat distracted. Not fully cooperative with examination. No focal deficits noted.  Lab Results:  Basic Metabolic Panel:  Recent Labs Lab 09/17/14 1925 09/18/14 0408  NA 130* 128*  K 4.9 4.6  CL 96 93*  CO2 27 24  GLUCOSE 279* 215*  BUN 26* 25*  CREATININE 1.63* 1.62*  CALCIUM 9.0 8.4   Liver Function Tests:  Recent Labs Lab 09/18/14 0408  AST 28  ALT 14  ALKPHOS 128*  BILITOT 1.6*  PROT 6.5  ALBUMIN 2.9*   CBC:  Recent Labs Lab 09/17/14 1925 09/18/14 0408  WBC 5.8 7.2  NEUTROABS  --  4.1  HGB 14.2 14.0  HCT 44.5 41.7  MCV 89.2 88.2  PLT 162 141*   Cardiac Enzymes:  Recent Labs Lab 09/18/14 0010 09/18/14 0408  TROPONINI 0.09* 0.08*   CBG:  Recent Labs Lab 09/17/14 1937 09/18/14 0732  GLUCAP 268* 152*    Studies/Results: Dg Chest 2 View  09/17/2014   CLINICAL DATA:  Cough.  Shortness of breath.  EXAM: CHEST  2 VIEW  COMPARISON:  Single frontal view 09/10/2013  FINDINGS: Cardiomegaly is again seen. There are bilateral pleural effusions and associated bibasilar airspace disease. This appears similar to prior exam. Minimal fluid is seen in the right minor fissure. There is  vascular congestion and mild pulmonary edema.  IMPRESSION: Findings consistent with congestive heart failure.   Electronically Signed   By: Rubye OaksMelanie  Ehinger M.D.   On: 09/17/2014 19:29   Ct Head Wo Contrast  09/17/2014   CLINICAL DATA:  Progressive weakness with dizziness and memory loss.  EXAM: CT HEAD WITHOUT CONTRAST  TECHNIQUE: Contiguous axial images were obtained from the base of the skull through the vertex without intravenous contrast.  COMPARISON:  10/29/2010  FINDINGS: No mass lesion. No midline shift. No acute hemorrhage or hematoma. No extra-axial fluid collections. No evidence of acute infarction. There is severe diffuse cerebral cortical and cerebellar atrophy, most severe in the temporal and  frontal lobes with secondary ventricular dilatation. There is fairly extensive periventricular white matter lucency consistent with chronic small vessel ischemic disease. Left globe prosthesis. No osseous abnormality.  IMPRESSION: No acute abnormality. Severe diffuse atrophy. Chronic small vessel ischemic changes.   Electronically Signed   By: Geanie CooleyJim  Maxwell M.D.   On: 09/17/2014 21:05    Medications:  Scheduled: . aspirin  325 mg Oral Daily  . enoxaparin (LOVENOX) injection  40 mg Subcutaneous QHS  . hydrALAZINE  10 mg Oral TID  . insulin aspart  0-9 Units Subcutaneous TID WC  . insulin glargine  37 Units Subcutaneous Daily  . levothyroxine  75 mcg Oral QAC breakfast  . pantoprazole  40 mg Oral Daily  . sodium chloride  3 mL Intravenous Q12H   Continuous:  WUJ:[id number]:acetaminophen, albuterol, ondansetron  Assessment/Plan:  Principal Problem:   Weakness Active Problems:   CHF (congestive heart failure)   Slurred speech   Acute renal failure   Diabetes mellitus type 2, uncontrolled   Hypothyroidism   Bradycardia    Weakness with slurred speech and bradycardia Etiology for her symptoms are not clear. Slurred speech was noted by the family. Daughter mentions that her speech is still not normal. MRI brain is pending to rule out stroke. Bradycardia could've contributed to her blood pressure was normal. She is noted to be on a beta blocker at home, which has been discontinued. Heart rate is improved and will be monitored. TSH is normal. Await echocardiogram. UA did not suggest UTI.  Possible Acute on Chronic systolic congestive heart failure. Last available. EF is 2010, which was 25-30%. Echocardiogram from today is pending. She has bilateral pleural effusions. He seems somewhat volume overloaded. Continue with intravenous Lasix for now. May need to involve cardiology.  Sinus bradycardia. Holding her beta blocker. TSH is normal. Continue to monitor on telemetry  Renal insufficiency. Could  be acute. The last available blood work is from December 2014 when the creatinine was 1.1. She likely has some element of chronic kidney disease. Her ARB is being held. Continue to monitor urine output. UA was reviewed.  History of hypothyroidism. Continue with Synthroid. TSH is normal.  Diabetes mellitus type 2 Continue sliding scale coverage. Continue Lantus. Holding her metformin. HbA1c 7.7.  Essential hypertension Monitor blood pressure closely. Continue hydralazine. Holding her beta blocker due to bradycardia.  DVT Prophylaxis: Lovenox    Code Status: Full code  Family Communication: Discussed with the daughter  Disposition Plan: Not ready for discharge    LOS: 1 day   [facility] KingfisherKRISHNAN,Momen Ham  [facility] Pager [phone] 09/18/2014, 9:55 AM  If 7PM-7AM, please contact night-coverage at [email or web address], password [email or web address]

## 2014-09-18 NOTE — Progress Notes (Signed)
Hypoglycemic Event  CBG: 49  Treatment: 15 GM carbohydrate snack  Symptoms: pt states head feels "swimmy"  Follow-up CBG: Time:2238 CBG Result:73  Possible Reasons for Event: Inadequate meal intake  Comments/MD notified:    [name], [name]  Remember to initiate Hypoglycemia Order Set & complete

## 2014-09-18 NOTE — Progress Notes (Signed)
Call placed to Dr. Toniann FailKakrakandy to inform him troponin 0.08, last value was 0.09. No new orders at this time.

## 2014-09-18 NOTE — Progress Notes (Signed)
Call placed to Dr. Toniann FailKakrakandy to inform him patient troponin 0.09, patient denies any chest pain at this time or since admission. Telephone order with read back received for 1 time dose of aspirin 325 mg now and patient already has order for daily aspirin. Change time of next troponin to 0400 and call if result is out of range.

## 2014-09-19 ENCOUNTER — Inpatient Hospital Stay (HOSPITAL_COMMUNITY): Payer: Medicare Other

## 2014-09-19 DIAGNOSIS — E1165 Type 2 diabetes mellitus with hyperglycemia: Secondary | ICD-10-CM

## 2014-09-19 DIAGNOSIS — I5021 Acute systolic (congestive) heart failure: Secondary | ICD-10-CM

## 2014-09-19 LAB — BASIC METABOLIC PANEL
Anion gap: 10 (ref 5–15)
BUN: 28 mg/dL — ABNORMAL HIGH (ref 6–23)
CO2: 27 mmol/L (ref 19–32)
Calcium: 8.9 mg/dL (ref 8.4–10.5)
Chloride: 95 mEq/L — ABNORMAL LOW (ref 96–112)
Creatinine, Ser: 1.6 mg/dL — ABNORMAL HIGH (ref 0.50–1.10)
GFR calc Af Amer: 33 mL/min — ABNORMAL LOW (ref >90)
GFR calc non Af Amer: 29 mL/min — ABNORMAL LOW (ref >90)
Glucose, Bld: 64 mg/dL — ABNORMAL LOW (ref 70–99)
Potassium: 4.5 mmol/L (ref 3.5–5.1)
SODIUM: 132 mmol/L — AB (ref 135–145)

## 2014-09-19 LAB — GLUCOSE, CAPILLARY
GLUCOSE-CAPILLARY: 38 mg/dL — AB (ref 70–99)
GLUCOSE-CAPILLARY: 98 mg/dL (ref 70–99)
GLUCOSE-CAPILLARY: 150 mg/dL — AB (ref 70–99)
GLUCOSE-CAPILLARY: 82 mg/dL (ref 70–99)
Glucose-Capillary: 69 mg/dL — ABNORMAL LOW (ref 70–99)
Glucose-Capillary: 143 mg/dL — ABNORMAL HIGH (ref 70–99)

## 2014-09-19 LAB — CBC
HEMATOCRIT: 40.7 % (ref 36.0–46.0)
Hemoglobin: 13.3 g/dL (ref 12.0–15.0)
MCH: 28.5 pg (ref 26.0–34.0)
MCHC: 32.7 g/dL (ref 30.0–36.0)
MCV: 87.3 fL (ref 78.0–100.0)
Platelets: 142 10*3/uL — ABNORMAL LOW (ref 150–400)
RBC: 4.66 MIL/uL (ref 3.87–5.11)
RDW: 15.7 % — ABNORMAL HIGH (ref 11.5–15.5)
WBC: 8.6 10*3/uL (ref 4.0–10.5)

## 2014-09-19 MED ORDER — FUROSEMIDE 10 MG/ML IJ SOLN
80.0000 mg | Freq: Once | INTRAMUSCULAR | Status: AC
  Administered 2014-09-19: 80 mg via INTRAVENOUS
  Filled 2014-09-19: qty 8

## 2014-09-19 MED ORDER — DEXTROSE 50 % IV SOLN
INTRAVENOUS | Status: AC
  Administered 2014-09-19 (×2): 25 mL
  Filled 2014-09-19: qty 50

## 2014-09-19 MED ORDER — INSULIN GLARGINE 100 UNIT/ML ~~LOC~~ SOLN
20.0000 [IU] | Freq: Every day | SUBCUTANEOUS | Status: DC
  Administered 2014-09-19: 20 [IU] via SUBCUTANEOUS
  Filled 2014-09-19 (×2): qty 0.2

## 2014-09-19 MED ORDER — ISOSORBIDE MONONITRATE 15 MG HALF TABLET
15.0000 mg | ORAL_TABLET | Freq: Every day | ORAL | Status: DC
  Administered 2014-09-19 – 2014-09-21 (×3): 15 mg via ORAL
  Filled 2014-09-19 (×3): qty 1

## 2014-09-19 NOTE — Progress Notes (Addendum)
[facility] PROGRESS NOTE  [name] ZOX:[id number] DOB: 1929-05-27 DOA: 09/17/2014  PCP: Florentina Jenny, MD  Brief HPI: 80 year old female with a history of chronic systolic heart failure, hypertension, diabetes, bilateral BKA, dementia, was brought in for weakness and slurred speech.  Past medical history:  Past Medical History  Diagnosis Date  . Systolic CHF, acute   . Cardiomyopathy     presumed nonischemic  . PVD (peripheral vascular disease)   . HTN (hypertension)   . DM2 (diabetes mellitus, type 2)   . Hypothyroidism   . Dementia   . Anxiety and depression   . Hypokalemia   . Acute osteomyelitis, ankle and foot   . Diabetic Charcot foot     history of    Consultants: None yet  Procedures:  2-D echocardiogram Study Conclusions - Left ventricle: The cavity size was normal. Wall thickness wasincreased in a pattern of mild LVH. Systolic function wasmoderately to severely reduced. The estimated ejection fractionwas in the range of 30% to 35%. Diffuse hypokinesis. There ishypokinesis of the entireinferior myocardium. Doppler parametersare consistent with restrictive physiology, indicative ofdecreased left ventricular diastolic compliance and/or increasedleft atrial pressure. Doppler parameters are consistent with highventricular filling pressure. - Aortic valve: There was mild regurgitation. - Mitral valve: Calcified annulus. Mildly thickened leaflets.There was mild regurgitation. - Left atrium: The atrium was mildly dilated. - Right ventricle: The cavity size was mildly dilated. - Right atrium: The atrium was moderately dilated. - Pulmonary arteries: PA peak pressure: 33 mm Hg (S).  Antibiotics: None  Subjective: Patient appears to be calmer today. She denies any chest pain. Breathing is better.   Objective: Vital Signs  Filed Vitals:   09/18/14 1326 09/18/14 2136 09/19/14 0153 09/19/14 0501  BP: 122/56 145/70 97/78 134/79  Pulse: 51 50 53  54  Temp: 97.6 F (36.4 C) 97.4 F (36.3 C) 97.7 F (36.5 C) 98 F (36.7 C)  TempSrc: Oral Oral Oral Oral  Resp: 18 18 18 18   Height:      Weight:    74.2 kg (163 lb 9.3 oz)  SpO2: 97% 97% 95% 95%    Intake/Output Summary (Last 24 hours) at 09/19/14 0852 Last data filed at 09/19/14 0640  Gross per 24 hour  Intake    480 ml  Output   2375 ml  Net  -1895 ml   Filed Weights   09/17/14 2305 09/18/14 0551 09/19/14 0501  Weight: 74.2 kg (163 lb 9.3 oz) 73.2 kg (161 lb 6 oz) 74.2 kg (163 lb 9.3 oz)    General appearance: alert, distracted, no distress and uncooperative Resp: Improving air entry. Crackles still present at the bases. Dullness to percussion in the bases. No wheezing. No rhonchi. Cardio: regular rate and rhythm, S1, S2 normal, no murmur, click, rub or gallop GI: soft, non-tender; bowel sounds normal; no masses,  no organomegaly Extremities: She is bilateral BKA Neurologic: She is alert. Somewhat distracted. Equal strength bilateral upper extremities. No facial droop.  Lab Results:  Basic Metabolic Panel:  Recent Labs Lab 09/17/14 1925 09/18/14 0408 09/19/14 0405  NA 130* 128* 132*  K 4.9 4.6 4.5  CL 96 93* 95*  CO2 27 24 27   GLUCOSE 279* 215* 64*  BUN 26* 25* 28*  CREATININE 1.63* 1.62* 1.60*  CALCIUM 9.0 8.4 8.9   Liver Function Tests:  Recent Labs Lab 09/18/14 0408  AST 28  ALT 14  ALKPHOS 128*  BILITOT 1.6*  PROT 6.5  ALBUMIN 2.9*   CBC:  Recent Labs Lab 09/17/14 1925 09/18/14 0408 09/19/14 0405  WBC 5.8 7.2 8.6  NEUTROABS  --  4.1  --   HGB 14.2 14.0 13.3  HCT 44.5 41.7 40.7  MCV 89.2 88.2 87.3  PLT 162 141* 142*   Cardiac Enzymes:  Recent Labs Lab 09/18/14 0010 09/18/14 0408 09/18/14 1218  TROPONINI 0.09* 0.08* 0.08*   CBG:  Recent Labs Lab 09/18/14 2235 09/18/14 2314 09/18/14 2349 09/19/14 0738 09/19/14 0809  GLUCAP 73 70 100* 38* 98    Studies/Results: Dg Chest 2 View  09/17/2014   CLINICAL DATA:  Cough.   Shortness of breath.  EXAM: CHEST  2 VIEW  COMPARISON:  Single frontal view 09/10/2013  FINDINGS: Cardiomegaly is again seen. There are bilateral pleural effusions and associated bibasilar airspace disease. This appears similar to prior exam. Minimal fluid is seen in the right minor fissure. There is vascular congestion and mild pulmonary edema.  IMPRESSION: Findings consistent with congestive heart failure.   Electronically Signed   By: Rubye OaksMelanie  Ehinger M.D.   On: 09/17/2014 19:29   Ct Head Wo Contrast  09/17/2014   CLINICAL DATA:  Progressive weakness with dizziness and memory loss.  EXAM: CT HEAD WITHOUT CONTRAST  TECHNIQUE: Contiguous axial images were obtained from the base of the skull through the vertex without intravenous contrast.  COMPARISON:  10/29/2010  FINDINGS: No mass lesion. No midline shift. No acute hemorrhage or hematoma. No extra-axial fluid collections. No evidence of acute infarction. There is severe diffuse cerebral cortical and cerebellar atrophy, most severe in the temporal and frontal lobes with secondary ventricular dilatation. There is fairly extensive periventricular white matter lucency consistent with chronic small vessel ischemic disease. Left globe prosthesis. No osseous abnormality.  IMPRESSION: No acute abnormality. Severe diffuse atrophy. Chronic small vessel ischemic changes.   Electronically Signed   By: Geanie CooleyJim  Maxwell M.D.   On: 09/17/2014 21:05    Medications:  Scheduled: . aspirin  325 mg Oral Daily  . enoxaparin (LOVENOX) injection  30 mg Subcutaneous QHS  . furosemide  80 mg Intravenous Once  . hydrALAZINE  10 mg Oral TID  . insulin aspart  0-9 Units Subcutaneous TID WC  . insulin glargine  20 Units Subcutaneous Daily  . levothyroxine  75 mcg Oral QAC breakfast  . LORazepam  0.5 mg Intravenous Once  . pantoprazole  40 mg Oral Daily  . sodium chloride  3 mL Intravenous Q12H   Continuous:  BMW:[id number]:acetaminophen, albuterol,  ondansetron  Assessment/Plan:  Principal Problem:   Weakness Active Problems:   CHF (congestive heart failure)   Slurred speech   Acute renal failure   Diabetes mellitus type 2, uncontrolled   Hypothyroidism   Bradycardia    Weakness with slurred speech and bradycardia Etiology for her symptoms are not clear. Slurred speech was noted by the family. She is now noted to have any focal deficits. MRI was attempted twice but patient refused to cooperate. Daughter did not want the patient to be given Ativan. So we will order a repeat CT head which will be done today. Bradycardia could've contributed but her blood pressure was normal. She is noted to be on a beta blocker at home, which has been discontinued. Heart rate is improved and will be monitored. TSH is normal. Echocardiogram as above. EF is 30-35%. UA did not suggest UTI.  Acute on Chronic systolic congestive heart failure. Echocardiogram report is reviewed. Continue with diuresis. Daughter wants to involve cardiology. We will consult. No ACE inhibitor due  to elevated creatinine. Consider hydralazine/nitrates.   Sinus bradycardia. Holding her beta blocker. TSH is normal. Continue to monitor on telemetry  Renal insufficiency. Could be acute. Remains stable. The last available blood work is from December 2014 when the creatinine was 1.1. She likely has some element of chronic kidney disease. Her ARB is being held. Continue to monitor urine output. UA was reviewed.  History of hypothyroidism. Continue with Synthroid. TSH is normal.  Diabetes mellitus type 2 with hypoglycemia Decrease dose of Lantus. Continue sliding scale coverage. Holding her metformin. HbA1c 7.7.  Essential hypertension Monitor blood pressure closely. Continue hydralazine. Holding her beta blocker due to bradycardia.  DVT Prophylaxis: Lovenox    Code Status: Full code  Family Communication: Discussed with the daughter  Disposition Plan: Not ready for  discharge    LOS: 2 days   [facility] Surgery Center IncKRISHNAN,Orine Goga  [facility] Pager [phone] 09/19/2014, 8:52 AM  If 7PM-7AM, please contact night-coverage at [email or web address], password [email or web address]

## 2014-09-19 NOTE — Progress Notes (Signed)
Hypoglycemic Event  CBG: 69  Treatment: D50 IV 25 mL  Symptoms: None  Follow-up CBG: Time:1245 CBG Result:150  Possible Reasons for Event: Unknown  Comments/MD notified:    [name], [name]  Remember to initiate Hypoglycemia Order Set & complete

## 2014-09-19 NOTE — Consult Note (Addendum)
CARDIOLOGY CONSULT NOTE   Patient ID: Aimee DellMary E Madden MRN: [id number], DOB/AGE: 03/19/1929   Admit date: 09/17/2014 Date of Consult: 09/19/2014   Primary Physician: Florentina JennyRIPP, HENRY, MD Primary Cardiologist: [facility]. ?Dr Jens Somrenshaw?  Pt. Profile  This 80 year old diabetic woman was admitted because of weakness and slurred speech on 09/17/14  Problem List  Past Medical History  Diagnosis Date  . Systolic CHF, acute   . Cardiomyopathy     presumed nonischemic  . PVD (peripheral vascular disease)   . HTN (hypertension)   . DM2 (diabetes mellitus, type 2)   . Hypothyroidism   . Dementia   . Anxiety and depression   . Hypokalemia   . Acute osteomyelitis, ankle and foot   . Diabetic Charcot foot     history of    Past Surgical History  Procedure Laterality Date  . Enucleation  1972  . Rectal polypectomy    . Amputation lower extremities       Allergies  No Known Allergies  HPI   This pleasant 80 year old woman was brought from home because of weakness and questionably slurred speech.  She has a past history of hypertension, diabetes, bilateral BK amputations, and dementia.  She also has a past history of chronic systolic heart failure.  The family was not aware of any previous ischemic testing.  She has a presumed nonischemic cardiomyopathy. Prior to this admission her previous echocardiogram was on 07/15/09 with results as noted below: 1. Left ventricle: The cavity size was normal. Wall thickness was  increased in a pattern of mild LVH. Systolic function was  severely reduced. The estimated ejection fraction was in the  range of 25% to 30%. Diffuse hypokinesis. Doppler parameters are  consistent with high ventricular filling pressure. 2. Aortic valve: Mild regurgitation. 3. Mitral valve: Calcified annulus. Severe regurgitation. 4. Left atrium: The atrium was moderately dilated. 5. Right ventricle: Systolic function was mildly to  moderately  reduced. 6. Tricuspid valve: Moderate regurgitation. 7. Pulmonary arteries: Systolic pressure was moderately increased.  PA peak pressure: 57mm Hg (S). 8. Pericardium, extracardiac: A small pericardial effusion was  identified. There was a left pleural effusion.  On this admission the patient had an echocardiogram on 09/18/14 with results as noted below: - Left ventricle: The cavity size was normal. Wall thickness was increased in a pattern of mild LVH. Systolic function was moderately to severely reduced. The estimated ejection fraction was in the range of 30% to 35%. Diffuse hypokinesis. There is hypokinesis of the entireinferior myocardium. Doppler parameters are consistent with restrictive physiology, indicative of decreased left ventricular diastolic compliance and/or increased left atrial pressure. Doppler parameters are consistent with high ventricular filling pressure. - Aortic valve: There was mild regurgitation. - Mitral valve: Calcified annulus. Mildly thickened leaflets . There was mild regurgitation. - Left atrium: The atrium was mildly dilated. - Right ventricle: The cavity size was mildly dilated. - Right atrium: The atrium was moderately dilated. - Pulmonary arteries: PA peak pressure: 33 mm Hg (S).  The patient denies any history of chest discomfort.  She is sedentary because of her bilateral below-knee amputations.  She has had variable degrees of shortness of breath.  At home she has been on Lasix 20 mg daily and depending on her symptoms she will sometimes be given a second Lasix if excess fluid is suggested by her physical examination.  Dr. Florentina JennyHenry Tripp is her PCP and his staff makes home visits to her home. On admission the patient was noted  to have had slurred speech.  The family indicates that the slurred speech has resolved over the past 2 days. The patient has refused MRI of the brain.  A CT of the brain is being  considered.  Inpatient Medications  . aspirin  325 mg Oral Daily  . enoxaparin (LOVENOX) injection  30 mg Subcutaneous QHS  . furosemide  80 mg Intravenous Once  . hydrALAZINE  10 mg Oral TID  . insulin aspart  0-9 Units Subcutaneous TID WC  . insulin glargine  20 Units Subcutaneous Daily  . levothyroxine  75 mcg Oral QAC breakfast  . LORazepam  0.5 mg Intravenous Once  . pantoprazole  40 mg Oral Daily  . sodium chloride  3 mL Intravenous Q12H    Family History Family History  Problem Relation Age of Onset  . Diabetes Mother   . Alzheimer's disease Mother   . Diabetes Father   . Alzheimer's disease Father      Social History History   Social History  . Marital Status: Widowed    Spouse Name: N/A    Number of Children: N/A  . Years of Education: N/A   Occupational History  . Not on file.   Social History Main Topics  . Smoking status: Never Smoker   . Smokeless tobacco: Never Used  . Alcohol Use: No     Comment: quit in 1982   . Drug Use: No  . Sexual Activity: Not on file   Other Topics Concern  . Not on file   Social History Narrative     Review of Systems  General:  No chills, fever, night sweats or weight changes.  Cardiovascular:  No chest pain, dyspnea on exertion, edema, orthopnea, palpitations, paroxysmal nocturnal dyspnea. Dermatological: No rash, lesions/masses Respiratory: No cough, dyspnea Urologic: No hematuria, dysuria Abdominal:   No nausea, vomiting, diarrhea, bright red blood per rectum, melena, or hematemesis Neurologic:  No visual changes, wkns, changes in mental status. All other systems reviewed and are otherwise negative except as noted above.  Physical Exam  Blood pressure 134/79, pulse 54, temperature 98 F (36.7 C), temperature source Oral, resp. rate 18, height 4\' 7"  (1.397 m), weight 163 lb 9.3 oz (74.2 kg), SpO2 95 %.  General: Pleasant, NAD.  Dementia is present Psych: Pleasant affect. Neuro:  Moves all extremities  spontaneously. HEENT: Normal  Neck: Supple without bruits or JVD. Lungs:  Resp regular and unlabored, mild inspiratory rales at right base Heart: Sinus bradycardia.  There is a grade 2/6 holosystolic murmur at apex consistent with mitral regurgitation.  No S3 gallop.  No rub. Abdomen: Soft, non-tender, non-distended, BS + x 4.  Extremities: No clubbing, cyanosis or edema.  Bilateral BKA  Labs   Recent Labs  09/18/14 0010 09/18/14 0408 09/18/14 1218  TROPONINI 0.09* 0.08* 0.08*   Lab Results  Component Value Date   WBC 8.6 09/19/2014   HGB 13.3 09/19/2014   HCT 40.7 09/19/2014   MCV 87.3 09/19/2014   PLT 142* 09/19/2014     Recent Labs Lab 09/18/14 0408 09/19/14 0405  NA 128* 132*  K 4.6 4.5  CL 93* 95*  CO2 24 27  BUN 25* 28*  CREATININE 1.62* 1.60*  CALCIUM 8.4 8.9  PROT 6.5  --   BILITOT 1.6*  --   ALKPHOS 128*  --   ALT 14  --   AST 28  --   GLUCOSE 215* 64*   Lab Results  Component Value Date   CHOL  82 09/18/2014   HDL 27* 09/18/2014   LDLCALC 44 09/18/2014   TRIG 56 09/18/2014   No results found for: DDIMER  Radiology/Studies  Dg Chest 2 View  09/17/2014   CLINICAL DATA:  Cough.  Shortness of breath.  EXAM: CHEST  2 VIEW  COMPARISON:  Single frontal view 09/10/2013  FINDINGS: Cardiomegaly is again seen. There are bilateral pleural effusions and associated bibasilar airspace disease. This appears similar to prior exam. Minimal fluid is seen in the right minor fissure. There is vascular congestion and mild pulmonary edema.  IMPRESSION: Findings consistent with congestive heart failure.   Electronically Signed   By: Rubye Oaks M.D.   On: 09/17/2014 19:29   Ct Head Wo Contrast  09/17/2014   CLINICAL DATA:  Progressive weakness with dizziness and memory loss.  EXAM: CT HEAD WITHOUT CONTRAST  TECHNIQUE: Contiguous axial images were obtained from the base of the skull through the vertex without intravenous contrast.  COMPARISON:  10/29/2010  FINDINGS:  No mass lesion. No midline shift. No acute hemorrhage or hematoma. No extra-axial fluid collections. No evidence of acute infarction. There is severe diffuse cerebral cortical and cerebellar atrophy, most severe in the temporal and frontal lobes with secondary ventricular dilatation. There is fairly extensive periventricular white matter lucency consistent with chronic small vessel ischemic disease. Left globe prosthesis. No osseous abnormality.  IMPRESSION: No acute abnormality. Severe diffuse atrophy. Chronic small vessel ischemic changes.   Electronically Signed   By: Geanie Cooley M.D.   On: 09/17/2014 21:05    ECG Sinus.  Bradycardia.  First degree AV block.  Right bundle branch block with right axis deviation.  Personally reviewed by me   ASSESSMENT AND PLAN  1.  Combined Systolic and diastolic chronic congestive heart failure with pulmonary vascular congestion and dyspnea.  Not on carvedilol now because of marked sinus bradycardia.  Not on ace/R but because of renal insufficiency.  Responding to higher doses of Lasix here in the hospital. 2.  Essential hypertension 3.  Diabetes mellitus, with peripheral arterial occlusive disease and prior BK amputations 4.  Dementia  Recommendation: Continue IV diuresis today.  Consider switch to oral Lasix tomorrow. She has minimally elevated and unchanging troponin I levels consistent with her chronic CHF.  No further ischemic testing is warranted because of her multiple comorbidities.  I will add low-dose oral nitrates empirically.  Lipids are low without statin therapy.  LDL 44   Signed, Cassell Clement, MD  09/19/2014, 1:58 PM

## 2014-09-19 NOTE — Progress Notes (Addendum)
Inpatient Diabetes Program Recommendations  AACE/ADA: New Consensus Statement on Inpatient Glycemic Control (2013)  Target Ranges:  Prepandial:   less than 140 mg/dL      Peak postprandial:   less than 180 mg/dL (1-2 hours)      Critically ill patients:  140 - 180 mg/dL     Results for [name], Thomasena E (MRN [id number]) as of 09/19/2014 08:43  Ref. Range 09/18/2014 07:32 09/18/2014 12:23 09/18/2014 17:00 09/18/2014 22:04 09/18/2014 22:35 09/18/2014 23:14  Glucose-Capillary Latest Range: 70-99 mg/dL 409152 (H) 811130 (H) 78 49 (L) 73 70    Results for [name], Alfie E (MRN [id number]) as of 09/19/2014 08:43  Ref. Range 09/19/2014 07:38 09/19/2014 08:09  Glucose-Capillary Latest Range: 70-99 mg/dL 38 (LL) 98     Current Orders: Lantus 37 units QAM      Novolog Sensitive SSI   **Patient with Hypoglycemia yesterday at bedtime (CBG 49 mg/dl) and also this AM (CBG 38 mg/dl).    MD- Please consider decreasing Lantus to 20 units QAM (this would be ~50% of her home dose of Lantus)    Will follow Ambrose FinlandJeannine Johnston Henslee Lottman RN, MSN, CDE Diabetes Coordinator Inpatient Diabetes Program Team Pager: [phone] (8a-10p)

## 2014-09-19 NOTE — Progress Notes (Signed)
Hypoglycemic Event  CBG: 38  Treatment: D50 IV 25 mL  Symptoms: None  Follow-up CBG: Time 0810 CBG Result:98  Possible Reasons for Event: Unknown  Comments/MD notified:    [name], [name]  Remember to initiate Hypoglycemia Order Set & complete

## 2014-09-20 LAB — GLUCOSE, CAPILLARY
GLUCOSE-CAPILLARY: 118 mg/dL — AB (ref 70–99)
GLUCOSE-CAPILLARY: 118 mg/dL — AB (ref 70–99)
Glucose-Capillary: 93 mg/dL (ref 70–99)
Glucose-Capillary: 75 mg/dL (ref 70–99)
Glucose-Capillary: 309 mg/dL — ABNORMAL HIGH (ref 70–99)
Glucose-Capillary: 162 mg/dL — ABNORMAL HIGH (ref 70–99)

## 2014-09-20 LAB — BASIC METABOLIC PANEL
Anion gap: 8 (ref 5–15)
BUN: 28 mg/dL — ABNORMAL HIGH (ref 6–23)
CALCIUM: 8.6 mg/dL (ref 8.4–10.5)
CO2: 28 mmol/L (ref 19–32)
CREATININE: 1.64 mg/dL — AB (ref 0.50–1.10)
Chloride: 95 mEq/L — ABNORMAL LOW (ref 96–112)
GFR calc non Af Amer: 28 mL/min — ABNORMAL LOW (ref >90)
GFR, EST AFRICAN AMERICAN: 32 mL/min — AB (ref >90)
GLUCOSE: 87 mg/dL (ref 70–99)
POTASSIUM: 3.9 mmol/L (ref 3.5–5.1)
Sodium: 131 mmol/L — ABNORMAL LOW (ref 135–145)

## 2014-09-20 MED ORDER — FUROSEMIDE 40 MG PO TABS: 60.0000 mg | ORAL_TABLET | Freq: Every day | ORAL | Status: DC

## 2014-09-20 MED ORDER — INSULIN GLARGINE 100 UNIT/ML ~~LOC~~ SOLN
15.0000 [IU] | Freq: Every day | SUBCUTANEOUS | Status: DC
  Administered 2014-09-20 – 2014-09-21 (×2): 15 [IU] via SUBCUTANEOUS
  Filled 2014-09-20 (×2): qty 0.15

## 2014-09-20 MED ORDER — FUROSEMIDE 40 MG PO TABS
40.0000 mg | ORAL_TABLET | Freq: Two times a day (BID) | ORAL | Status: DC
  Administered 2014-09-20 – 2014-09-21 (×3): 40 mg via ORAL
  Filled 2014-09-20 (×3): qty 1

## 2014-09-20 MED ORDER — VITAMINS A & D EX OINT
TOPICAL_OINTMENT | CUTANEOUS | Status: AC
  Administered 2014-09-20: 20:00:00
  Filled 2014-09-20: qty 5

## 2014-09-20 NOTE — Progress Notes (Signed)
Patient Name: [name] Date of Encounter: 09/20/2014     Principal Problem:   CONGESTIVE HEART FAILURE, ACUTE, SYSTOLIC Active Problems:   Weakness   Slurred speech   Acute renal failure   Diabetes mellitus type 2, uncontrolled   Hypothyroidism   Bradycardia    SUBJECTIVE  Patient is lethargic this am. Has not eaten breakfast yet. Denies chest pain or dyspnea.  CURRENT MEDS . aspirin  325 mg Oral Daily  . enoxaparin (LOVENOX) injection  30 mg Subcutaneous QHS  . hydrALAZINE  10 mg Oral TID  . insulin aspart  0-9 Units Subcutaneous TID WC  . insulin glargine  20 Units Subcutaneous Daily  . isosorbide mononitrate  15 mg Oral Daily  . levothyroxine  75 mcg Oral QAC breakfast  . LORazepam  0.5 mg Intravenous Once  . pantoprazole  40 mg Oral Daily  . sodium chloride  3 mL Intravenous Q12H    OBJECTIVE  Filed Vitals:   09/19/14 1446 09/19/14 2130 09/20/14 0204 09/20/14 0420  BP: 136/72 145/46 135/41 126/58  Pulse: 58 58 61 63  Temp: 98.1 F (36.7 C) 98.1 F (36.7 C) 99.5 F (37.5 C) 98.9 F (37.2 C)  TempSrc: Oral Oral Oral Oral  Resp: Height:      Weight:    150 lb 5.7 oz (68.2 kg)  SpO2: 96% 93% 97% 95%    Intake/Output Summary (Last 24 hours) at 09/20/14 0920 Last data filed at 09/20/14 0426  Gross per 24 hour  Intake    240 ml  Output   6025 ml  Net  -5785 ml   Filed Weights   09/18/14 0551 09/19/14 0501 09/20/14 0420  Weight: 161 lb 6 oz (73.2 kg) 163 lb 9.3 oz (74.2 kg) 150 lb 5.7 oz (68.2 kg)    PHYSICAL EXAM  General: Pleasant, NAD. Dementia is present Psych: Pleasant affect. Neuro: Moves all extremities spontaneously. HEENT: Normal Neck: Supple without bruits or JVD. Lungs: Resp regular and unlabored, mild inspiratory rales at right base Heart: Sinus bradycardia. There is a grade 1/6 holosystolic murmur at apex consistent with mitral regurgitation. No S3 gallop. No rub. Abdomen: Soft, non-tender,  non-distended, BS + x 4.  Extremities: No clubbing, cyanosis or edema. Bilateral BKA  Accessory Clinical Findings  CBC  Recent Labs  09/18/14 0408 09/19/14 0405  WBC 7.2 8.6  NEUTROABS 4.1  --   HGB 14.0 13.3  HCT 41.7 40.7  MCV 88.2 87.3  PLT 141* 142*   Basic Metabolic Panel  Recent Labs  09/19/14 0405 09/20/14 0410  NA 132* 131*  K 4.5 3.9  CL 95* 95*  CO2 27 28  GLUCOSE 64* 87  BUN 28* 28*  CREATININE 1.60* 1.64*  CALCIUM 8.9 8.6   Liver Function Tests  Recent Labs  09/18/14 0408  AST 28  ALT 14  ALKPHOS 128*  BILITOT 1.6*  PROT 6.5  ALBUMIN 2.9*   No results for input(s): LIPASE, AMYLASE in the last 72 hours. Cardiac Enzymes  Recent Labs  09/18/14 0010 09/18/14 0408 09/18/14 1218  TROPONINI 0.09* 0.08* 0.08*   BNP Invalid input(s): POCBNP D-Dimer No results for input(s): DDIMER in the last 72 hours. Hemoglobin A1C  Recent Labs  09/18/14 0408  HGBA1C 7.7*   Fasting Lipid Panel  Recent Labs  09/18/14 0408  CHOL 82  HDL 27*  LDLCALC 44  TRIG 56  CHOLHDL 3.0   Thyroid Function Tests  Recent Labs  09/18/14  0409  TSH 1.632    TELE  NSR  ECG    Radiology/Studies  Dg Chest 2 View  09/17/2014   CLINICAL DATA:  Cough.  Shortness of breath.  EXAM: CHEST  2 VIEW  COMPARISON:  Single frontal view 09/10/2013  FINDINGS: Cardiomegaly is again seen. There are bilateral pleural effusions and associated bibasilar airspace disease. This appears similar to prior exam. Minimal fluid is seen in the right minor fissure. There is vascular congestion and mild pulmonary edema.  IMPRESSION: Findings consistent with congestive heart failure.   Electronically Signed   By: Rubye Oaks M.D.   On: 09/17/2014 19:29   Ct Head Wo Contrast  09/19/2014   CLINICAL DATA:  Slurred speech.  EXAM: CT HEAD WITHOUT CONTRAST  TECHNIQUE: Contiguous axial images were obtained from the base of the skull through the vertex without intravenous contrast.   COMPARISON:  CT scan of September 17, 2014.  FINDINGS: Bony calvarium appears intact. Mild diffuse cortical atrophy is noted. Mild chronic ischemic white matter disease is noted. No mass effect or midline shift is noted. Ventricular size is within normal limits. There is no evidence of mass lesion, hemorrhage or acute infarction.  IMPRESSION: Mild diffuse cortical atrophy. Mild chronic ischemic white matter disease. No acute intracranial abnormality seen.   Electronically Signed   By: Roque Lias M.D.   On: 09/19/2014 15:20   Ct Head Wo Contrast  09/17/2014   CLINICAL DATA:  Progressive weakness with dizziness and memory loss.  EXAM: CT HEAD WITHOUT CONTRAST  TECHNIQUE: Contiguous axial images were obtained from the base of the skull through the vertex without intravenous contrast.  COMPARISON:  10/29/2010  FINDINGS: No mass lesion. No midline shift. No acute hemorrhage or hematoma. No extra-axial fluid collections. No evidence of acute infarction. There is severe diffuse cerebral cortical and cerebellar atrophy, most severe in the temporal and frontal lobes with secondary ventricular dilatation. There is fairly extensive periventricular white matter lucency consistent with chronic small vessel ischemic disease. Left globe prosthesis. No osseous abnormality.  IMPRESSION: No acute abnormality. Severe diffuse atrophy. Chronic small vessel ischemic changes.   Electronically Signed   By: Geanie Cooley M.D.   On: 09/17/2014 21:05    ASSESSMENT AND PLAN 1. Combined Systolic and diastolic chronic congestive heart failure with pulmonary vascular congestion and dyspnea. Not on carvedilol now because of marked sinus bradycardia. Not on ace/R but because of renal insufficiency. Responding to higher doses of Lasix here in the hospital. 2. Essential hypertension 3. Diabetes mellitus, with peripheral arterial occlusive disease and prior BK amputations 4. Dementia  Recommendation: Will switch to oral lasix 40 mg  BID today. She has minimally elevated and unchanging troponin I levels consistent with her chronic CHF. No further ischemic testing is warranted because of her multiple comorbidities. I will add low-dose oral nitrates empirically. Lipids are low without statin therapy. LDL 44 Anticipate home soon.   Signed, Cassell Clement MD

## 2014-09-20 NOTE — Progress Notes (Signed)
[facility] PROGRESS NOTE  FIDELIA CATHERS ZOX:[id number] DOB: Nov 03, 1928 DOA: 09/17/2014  PCP: Florentina Jenny, MD  Brief HPI: 81 year old female with a history of chronic systolic heart failure, hypertension, diabetes, bilateral BKA, dementia, was brought in for weakness and slurred speech. She was admitted for further management.  Past medical history:  Past Medical History  Diagnosis Date  . Systolic CHF, acute   . Cardiomyopathy     presumed nonischemic  . PVD (peripheral vascular disease)   . HTN (hypertension)   . DM2 (diabetes mellitus, type 2)   . Hypothyroidism   . Dementia   . Anxiety and depression   . Hypokalemia   . Acute osteomyelitis, ankle and foot   . Diabetic Charcot foot     history of    Consultants: Cardiology  Procedures:  2-D echocardiogram Study Conclusions - Left ventricle: The cavity size was normal. Wall thickness wasincreased in a pattern of mild LVH. Systolic function wasmoderately to severely reduced. The estimated ejection fractionwas in the range of 30% to 35%. Diffuse hypokinesis. There ishypokinesis of the entireinferior myocardium. Doppler parametersare consistent with restrictive physiology, indicative ofdecreased left ventricular diastolic compliance and/or increasedleft atrial pressure. Doppler parameters are consistent with highventricular filling pressure. - Aortic valve: There was mild regurgitation. - Mitral valve: Calcified annulus. Mildly thickened leaflets.There was mild regurgitation. - Left atrium: The atrium was mildly dilated. - Right ventricle: The cavity size was mildly dilated. - Right atrium: The atrium was moderately dilated. - Pulmonary arteries: PA peak pressure: 33 mm Hg (S).  Antibiotics: None  Subjective: Patient feels well. Denies any chest pain. No shortness of breath.   Objective: Vital Signs  Filed Vitals:   09/19/14 1446 09/19/14 2130 09/20/14 0204 09/20/14 0420  BP: 136/72 145/46  135/41 126/58  Pulse: 58 58 61 63  Temp: 98.1 F (36.7 C) 98.1 F (36.7 C) 99.5 F (37.5 C) 98.9 F (37.2 C)  TempSrc: Oral Oral Oral Oral  Resp: Height:      Weight:    68.2 kg (150 lb 5.7 oz)  SpO2: 96% 93% 97% 95%    Intake/Output Summary (Last 24 hours) at 09/20/14 0935 Last data filed at 09/20/14 0426  Gross per 24 hour  Intake    240 ml  Output   6025 ml  Net  -5785 ml   Filed Weights   09/18/14 0551 09/19/14 0501 09/20/14 0420  Weight: 73.2 kg (161 lb 6 oz) 74.2 kg (163 lb 9.3 oz) 68.2 kg (150 lb 5.7 oz)    General appearance: alert, distracted, no distress and uncooperative Resp: Improving air entry. No crackles appreciated today. No wheezing. No rhonchi. Cardio: regular rate and rhythm, S1, S2 normal, no murmur, click, rub or gallop GI: soft, non-tender; bowel sounds normal; no masses,  no organomegaly Extremities: She is bilateral BKA Neurologic: She is alert. Somewhat distracted. Equal strength bilateral upper extremities. No facial droop.  Lab Results:  Basic Metabolic Panel:  Recent Labs Lab 09/17/14 1925 09/18/14 0408 09/19/14 0405 09/20/14 0410  NA 130* 128* 132* 131*  K 4.9 4.6 4.5 3.9  CL 96 93* 95* 95*  CO2 GLUCOSE 279* 215* 64* 87  BUN 26* 25* 28* 28*  CREATININE 1.63* 1.62* 1.60* 1.64*  CALCIUM 9.0 8.4 8.9 8.6   Liver Function Tests:  Recent Labs Lab 09/18/14 0408  AST 28  ALT 14  ALKPHOS 128*  BILITOT 1.6*  PROT 6.5  ALBUMIN 2.9*  CBC:  Recent Labs Lab 09/17/14 1925 09/18/14 0408 09/19/14 0405  WBC 5.8 7.2 8.6  NEUTROABS  --  4.1  --   HGB 14.2 14.0 13.3  HCT 44.5 41.7 40.7  MCV 89.2 88.2 87.3  PLT 162 141* 142*   Cardiac Enzymes:  Recent Labs Lab 09/18/14 0010 09/18/14 0408 09/18/14 1218  TROPONINI 0.09* 0.08* 0.08*   CBG:  Recent Labs Lab 09/19/14 1245 09/19/14 1744 09/19/14 2131 09/20/14 0204 09/20/14 0802  GLUCAP 150* 143* 82 93 75    Studies/Results: Ct Head Wo  Contrast  09/19/2014   CLINICAL DATA:  Slurred speech.  EXAM: CT HEAD WITHOUT CONTRAST  TECHNIQUE: Contiguous axial images were obtained from the base of the skull through the vertex without intravenous contrast.  COMPARISON:  CT scan of September 17, 2014.  FINDINGS: Bony calvarium appears intact. Mild diffuse cortical atrophy is noted. Mild chronic ischemic white matter disease is noted. No mass effect or midline shift is noted. Ventricular size is within normal limits. There is no evidence of mass lesion, hemorrhage or acute infarction.  IMPRESSION: Mild diffuse cortical atrophy. Mild chronic ischemic white matter disease. No acute intracranial abnormality seen.   Electronically Signed   By: Roque Lias M.D.   On: 09/19/2014 15:20    Medications:  Scheduled: . aspirin  325 mg Oral Daily  . enoxaparin (LOVENOX) injection  30 mg Subcutaneous QHS  . furosemide  40 mg Oral BID  . hydrALAZINE  10 mg Oral TID  . insulin aspart  0-9 Units Subcutaneous TID WC  . insulin glargine  15 Units Subcutaneous Daily  . isosorbide mononitrate  15 mg Oral Daily  . levothyroxine  75 mcg Oral QAC breakfast  . LORazepam  0.5 mg Intravenous Once  . pantoprazole  40 mg Oral Daily  . sodium chloride  3 mL Intravenous Q12H   Continuous:  WGN:[id number], albuterol, ondansetron  Assessment/Plan:  Principal Problem:   CONGESTIVE HEART FAILURE, ACUTE, SYSTOLIC Active Problems:   Weakness   Slurred speech   Acute renal failure   Diabetes mellitus type 2, uncontrolled   Hypothyroidism   Bradycardia    Weakness with slurred speech and bradycardia Etiology for her symptoms are not clear. Slurred speech was noted by the family. She is not noted to have any focal deficits. MRI was attempted twice but patient refused to cooperate. Daughter did not want the patient to be given Ativan. So repeat CT head was ordered. No acute changes were noted. Unlikely this is a stroke. Bradycardia could've contributed but her  blood pressure was normal. She is noted to be on a beta blocker at home, which has been discontinued. TSH is normal. Echocardiogram as above. EF is 30-35%. UA did not suggest UTI.  Acute on Chronic systolic congestive heart failure. Echocardiogram report is reviewed. Patient is improved. Cardiology input is appreciated. Change to oral Lasix today. EF is 30-35%. No ACE inhibitor due to elevated creatinine. Continue hydralazine/nitrates.   Sinus bradycardia. We will have to discontinue her beta blocker. TSH is normal. Continue to monitor on telemetry  Renal insufficiency. Most likely she has chronic kidney disease. Creatinine remains stable. The last available blood work is from December 2014 when the creatinine was 1.1. Her ARB is being held and will have to be discontinued. Continue to monitor urine output. UA was reviewed.  History of hypothyroidism. Continue with Synthroid. TSH is normal.  Diabetes mellitus type 2 with hypoglycemia CBG is improved but still running low. Further decrease dose  of Lantus. Continue sliding scale coverage. Holding her metformin. HbA1c 7.7.  Essential hypertension Monitor blood pressure closely. Continue hydralazine. Holding her beta blocker due to bradycardia.  DVT Prophylaxis: Lovenox    Code Status: Full code  Family Communication: Discussed with the daughter  Disposition Plan: Not ready for discharge.    LOS: 3 days   [facility]  [facility] Pager [phone] 09/20/2014, 9:35 AM  If 7PM-7AM, please contact night-coverage at [email or web address], password [facility]

## 2014-09-21 DIAGNOSIS — R001 Bradycardia, unspecified: Secondary | ICD-10-CM

## 2014-09-21 LAB — BASIC METABOLIC PANEL
Anion gap: 10 (ref 5–15)
BUN: 24 mg/dL — AB (ref 6–23)
CO2: 28 mmol/L (ref 19–32)
Calcium: 8.7 mg/dL (ref 8.4–10.5)
Chloride: 98 mEq/L (ref 96–112)
Creatinine, Ser: 1.49 mg/dL — ABNORMAL HIGH (ref 0.50–1.10)
GFR calc Af Amer: 36 mL/min — ABNORMAL LOW (ref >90)
GFR, EST NON AFRICAN AMERICAN: 31 mL/min — AB (ref >90)
Glucose, Bld: 109 mg/dL — ABNORMAL HIGH (ref 70–99)
Potassium: 3.6 mmol/L (ref 3.5–5.1)
SODIUM: 136 mmol/L (ref 135–145)

## 2014-09-21 LAB — GLUCOSE, CAPILLARY
GLUCOSE-CAPILLARY: 110 mg/dL — AB (ref 70–99)
Glucose-Capillary: 108 mg/dL — ABNORMAL HIGH (ref 70–99)

## 2014-09-21 MED ORDER — FUROSEMIDE 40 MG PO TABS: ORAL_TABLET | ORAL | Status: DC

## 2014-09-21 MED ORDER — INSULIN GLARGINE 100 UNIT/ML ~~LOC~~ SOLN: 20.0000 [IU] | Freq: Every day | SUBCUTANEOUS | Status: AC

## 2014-09-21 MED ORDER — ISOSORBIDE MONONITRATE 15 MG HALF TABLET: 15.0000 mg | ORAL_TABLET | Freq: Every day | ORAL | Status: DC

## 2014-09-21 NOTE — Progress Notes (Signed)
Patient Name: [name] Date of Encounter: 09/21/2014     Principal Problem:   CONGESTIVE HEART FAILURE, ACUTE, SYSTOLIC Active Problems:   Weakness   Slurred speech   Acute renal failure   Diabetes mellitus type 2, uncontrolled   Hypothyroidism   Bradycardia    SUBJECTIVE  81 yo with hx of DM, chronic systolic CHF.  Admitted with stroke symptoms, unable to do head MRI due to lack of cooperation from patient. Head CT did not show any abnormalities.   Very sleepy today   CURRENT MEDS . aspirin  325 mg Oral Daily  . enoxaparin (LOVENOX) injection  30 mg Subcutaneous QHS  . furosemide  40 mg Oral BID  . hydrALAZINE  10 mg Oral TID  . insulin aspart  0-9 Units Subcutaneous TID WC  . insulin glargine  15 Units Subcutaneous Daily  . isosorbide mononitrate  15 mg Oral Daily  . levothyroxine  75 mcg Oral QAC breakfast  . LORazepam  0.5 mg Intravenous Once  . pantoprazole  40 mg Oral Daily  . sodium chloride  3 mL Intravenous Q12H    OBJECTIVE  Filed Vitals:   09/20/14 0420 09/20/14 1500 09/20/14 2133 09/21/14 0531  BP: 126/58 131/48 142/46 157/63  Pulse: 63 63 67 67  Temp: 98.9 F (37.2 C) 97.7 F (36.5 C) 98.1 F (36.7 C) 98.2 F (36.8 C)  TempSrc: Oral Oral Oral Oral  Resp: Height:      Weight: 150 lb 5.7 oz (68.2 kg)   146 lb 6.2 oz (66.4 kg)  SpO2: 95% 100% 96% 98%    Intake/Output Summary (Last 24 hours) at 09/21/14 0828 Last data filed at 09/20/14 2034  Gross per 24 hour  Intake    440 ml  Output    725 ml  Net   -285 ml   Filed Weights   09/19/14 0501 09/20/14 0420 09/21/14 0531  Weight: 163 lb 9.3 oz (74.2 kg) 150 lb 5.7 oz (68.2 kg) 146 lb 6.2 oz (66.4 kg)    PHYSICAL EXAM  General: Pleasant, NAD. Dementia is present Psych: Pleasant affect. Neuro: Moves all extremities spontaneously. HEENT: Normal Neck: Supple without bruits or JVD. Lungs: Resp regular and unlabored, mild inspiratory rales at right  base Heart: Sinus bradycardia. There is a grade 1/6 holosystolic murmur at apex consistent with mitral regurgitation. No S3 gallop. No rub. Abdomen: Soft, non-tender, non-distended, BS + x 4.  Extremities:  Bilateral BKA  Accessory Clinical Findings  CBC  Recent Labs  09/19/14 0405  WBC 8.6  HGB 13.3  HCT 40.7  MCV 87.3  PLT 142*   Basic Metabolic Panel  Recent Labs  09/20/14 0410 09/21/14 0526  NA 131* 136  K 3.9 3.6  CL 95* 98  CO2 28 28  GLUCOSE 87 109*  BUN 28* 24*  CREATININE 1.64* 1.49*  CALCIUM 8.6 8.7   Liver Function Tests No results for input(s): AST, ALT, ALKPHOS, BILITOT, PROT, ALBUMIN in the last 72 hours. No results for input(s): LIPASE, AMYLASE in the last 72 hours. Cardiac Enzymes  Recent Labs  09/18/14 1218  TROPONINI 0.08*   BNP Invalid input(s): POCBNP D-Dimer No results for input(s): DDIMER in the last 72 hours. Hemoglobin A1C No results for input(s): HGBA1C in the last 72 hours. Fasting Lipid Panel No results for input(s): CHOL, HDL, LDLCALC, TRIG, CHOLHDL, LDLDIRECT in the last 72 hours. Thyroid Function Tests No results for input(s): TSH, T4TOTAL, T3FREE, THYROIDAB in  the last 72 hours.  Invalid input(s): FREET3  TELE  NSR  ECG    Radiology/Studies  Dg Chest 2 View  09/17/2014   CLINICAL DATA:  Cough.  Shortness of breath.  EXAM: CHEST  2 VIEW  COMPARISON:  Single frontal view 09/10/2013  FINDINGS: Cardiomegaly is again seen. There are bilateral pleural effusions and associated bibasilar airspace disease. This appears similar to prior exam. Minimal fluid is seen in the right minor fissure. There is vascular congestion and mild pulmonary edema.  IMPRESSION: Findings consistent with congestive heart failure.   Electronically Signed   By: Rubye Oaks M.D.   On: 09/17/2014 19:29   Ct Head Wo Contrast  09/19/2014   CLINICAL DATA:  Slurred speech.  EXAM: CT HEAD WITHOUT CONTRAST  TECHNIQUE: Contiguous axial images were  obtained from the base of the skull through the vertex without intravenous contrast.  COMPARISON:  CT scan of September 17, 2014.  FINDINGS: Bony calvarium appears intact. Mild diffuse cortical atrophy is noted. Mild chronic ischemic white matter disease is noted. No mass effect or midline shift is noted. Ventricular size is within normal limits. There is no evidence of mass lesion, hemorrhage or acute infarction.  IMPRESSION: Mild diffuse cortical atrophy. Mild chronic ischemic white matter disease. No acute intracranial abnormality seen.   Electronically Signed   By: Roque Lias M.D.   On: 09/19/2014 15:20   Ct Head Wo Contrast  09/17/2014   CLINICAL DATA:  Progressive weakness with dizziness and memory loss.  EXAM: CT HEAD WITHOUT CONTRAST  TECHNIQUE: Contiguous axial images were obtained from the base of the skull through the vertex without intravenous contrast.  COMPARISON:  10/29/2010  FINDINGS: No mass lesion. No midline shift. No acute hemorrhage or hematoma. No extra-axial fluid collections. No evidence of acute infarction. There is severe diffuse cerebral cortical and cerebellar atrophy, most severe in the temporal and frontal lobes with secondary ventricular dilatation. There is fairly extensive periventricular white matter lucency consistent with chronic small vessel ischemic disease. Left globe prosthesis. No osseous abnormality.  IMPRESSION: No acute abnormality. Severe diffuse atrophy. Chronic small vessel ischemic changes.   Electronically Signed   By: Geanie Cooley M.D.   On: 09/17/2014 21:05    ASSESSMENT AND PLAN 1. Combined Systolic and diastolic chronic congestive heart failure with pulmonary vascular congestion and dyspnea. Not on carvedilol now because of marked sinus bradycardia. Not on ace/R but because of renal insufficiency.  Now on oral lasix No new recs.  She is not a candidate for invasive testing.  Continue medical therapy. Follow up with her medical doctor 2. Essential  hypertension 3. Diabetes mellitus, with peripheral arterial occlusive disease and prior BK amputations 4. Dementia  No further cardiology recommendations Will sign off.  Call for questions   Alvia Grove., MD, [facility] 09/21/2014, 8:34 AM [address]  [address] Office [phone] Pager [phone]

## 2014-09-21 NOTE — Discharge Instructions (Signed)

## 2014-09-21 NOTE — Progress Notes (Signed)
CARE MANAGEMENT NOTE 09/21/2014  Patient:  [name], [name]   Account Number:  [id number]  Date Initiated:  09/18/2014  Documentation initiated by:  Ferdinand Cava  Subjective/Objective Assessment:   81 yo female admitted with possible stroke and weakness with slurred speech     Action/Plan:   discharge planning   Anticipated DC Date:  09/19/2014   Anticipated DC Plan:  HOME/SELF CARE      DC Planning Services  CM consult      [facility] Choice  HOME HEALTH   Choice offered to / List presented to:  C-1 Patient        HH arranged  HH-1 RN      [facility] agency  [facility]   Status of service:  Completed, signed off Medicare Important Message given?  YES (If response is "NO", the following Medicare IM given date fields will be blank) Date Medicare IM given:  09/21/2014 Medicare IM given by:  [facility] Date Additional Medicare IM given:   Additional Medicare IM given by:    Discharge Disposition:  HOME W HOME HEALTH SERVICES  Per UR Regulation:    If discussed at Long Length of Stay Meetings, dates discussed:    Comments:  09/21/2014 1320 NCM spoke to pt and gave permission to speak to Caregiver, Jeananne Rama [phone]. Offered choice for [facility]. Requesting [facility] for [facility]. Contacted [facility] for [facility] RN for scheduled dc home today. Isidoro Donning RN CCM Case Mgmt phone [phone]  09/08/14 Ferdinand Cava RN BSn CM [phone] Received return call from [facility] DME rep, Mayra Reel, stating that patient is eligible for updgrade with [facility] after 09/02/2015. She will follow up on the status of receiving a new mattress. will continue to follow  09/18/14 Ferdinand Cava RN BSN CM [phone] Spoke with patient daughter, Octavio Graves, at the bedside and she stated that she lives with her mother and cares for her 24/7. They have a hospitake bed, hoyer lift, BSC, shower chair, and wc. She would like a new mattess for the bed and a new BSC with drop down handles. This CM l/m for [facility] DME liaison  Lecretia, reagrding eligibility for new mattress and BSC upgrade. Octavio Graves also stated that the patient has had HH services in the past with [place] and [place] and likes both agencies. Will follow up on DME eligibility and any HH needs, orders

## 2014-09-21 NOTE — Discharge Summary (Addendum)
[facility]  Physician Discharge Summary   Patient ID: [name] MRN: [id number] DOB/AGE: 27-May-1929 81 y.o.  Admit date: 09/17/2014 Discharge date: 09/21/2014  PCP: Florentina Jenny, MD  DISCHARGE DIAGNOSES:  Principal Problem:   CONGESTIVE HEART FAILURE, ACUTE, SYSTOLIC Active Problems:   Weakness   Slurred speech   Acute renal failure   Diabetes mellitus type 2, uncontrolled   Hypothyroidism   Bradycardia   RECOMMENDATIONS FOR OUTPATIENT FOLLOW UP: 1. Home health was arranged 2. Lasix dose increased. Home health RN to follow up.   DISCHARGE CONDITION: fair  Diet recommendation: Mod Carb  Filed Weights   09/19/14 0501 09/20/14 0420 09/21/14 0531  Weight: 74.2 kg (163 lb 9.3 oz) 68.2 kg (150 lb 5.7 oz) 66.4 kg (146 lb 6.2 oz)    INITIAL HISTORY: 81 year old female with a history of chronic systolic heart failure, hypertension, diabetes, bilateral BKA, dementia, was brought in for weakness and slurred speech. She was admitted for further management.  Consultations:  Cardiology   Procedures:  2-D echocardiogram Study Conclusions - Left ventricle: The cavity size was normal. Wall thickness wasincreased in a pattern of mild LVH. Systolic function wasmoderately to severely reduced. The estimated ejection fractionwas in the range of 30% to 35%. Diffuse hypokinesis. There ishypokinesis of the entireinferior myocardium. Doppler parametersare consistent with restrictive physiology, indicative ofdecreased left ventricular diastolic compliance and/or increasedleft atrial pressure. Doppler parameters are consistent with highventricular filling pressure. - Aortic valve: There was mild regurgitation. - Mitral valve: Calcified annulus. Mildly thickened leaflets.There was mild regurgitation. - Left atrium: The atrium was mildly dilated. - Right ventricle: The cavity size was mildly dilated. - Right atrium: The atrium was moderately dilated. - Pulmonary  arteries: PA peak pressure: 33 mm Hg (S).  HOSPITAL COURSE:   Weakness with slurred speech and bradycardia Etiology for her symptoms were not clear. Slurred speech was noted by the family. She was not noted to have any focal deficits. MRI was attempted twice but patient refused to cooperate. Daughter did not want the patient to be given Ativan. So repeat CT head was ordered. No acute changes were noted. Unlikely this is a stroke. Bradycardia could've contributed but her blood pressure was normal. She is noted to be on a beta blocker at home, which has been discontinued. TSH is normal. Echocardiogram as above. EF is 30-35%. UA did not suggest UTI.  Acute on Chronic systolic congestive heart failure. Echocardiogram report is reviewed. Patient was given IV lasix. Cardiology was consulted per family request. She was transitioned to oral lasix. She is not a candidate for further invasive testing due to comorbidities. EF is 30-35%. No ACE inhibitor due to elevated creatinine. Continue hydralazine/nitrates. Discontinue ARB. Home health RN will be arranged.  Sinus bradycardia. We have discontinued her beta blocker. TSH is normal.   Renal insufficiency. Most likely she has chronic kidney disease. Creatinine remains stable. The last available blood work is from December 2014 when the creatinine was 1.1. Her ARB was held and will be discontinued.   History of hypothyroidism. Continue with Synthroid. TSH is normal.  Diabetes mellitus type 2 with hypoglycemia She was noted to be hypoglycemic in the hospital. Lantus dose was reduced. Per daughter patient is not compliant with her diet at home. So she probably needs higher dose at home and lower in hospital due to strict diet here. For now she will be discharged on a lower dose of lantus. Daughter checks her blood sugar 4 times daily and can make adjustment to  her Lantus per instructions. Will discontinue Metformin due to elevated creatinine. HbA1c  7.7.  Essential hypertension BP remained stable. We had to stop her BB and ARB. Nitrate was added. May need to adjust dose of hydralazine if BP remains high. Home health RN to check.  History of Bilateral BKA's Stable  Overall she remains stable. She has been noted to be confused at times. She likely has cognitive impairment. She lives with her daughter. She will be discharged today.   PERTINENT LABS:  The results of significant diagnostics from this hospitalization (including imaging, microbiology, ancillary and laboratory) are listed below for reference.    Labs: Basic Metabolic Panel:  Recent Labs Lab 09/17/14 1925 09/18/14 0408 09/19/14 0405 09/20/14 0410 09/21/14 0526  NA 130* 128* 132* 131* 136  K 4.9 4.6 4.5 3.9 3.6  CL 96 93* 95* 95* 98  CO2 GLUCOSE 279* 215* 64* 87 109*  BUN 26* 25* 28* 28* 24*  CREATININE 1.63* 1.62* 1.60* 1.64* 1.49*  CALCIUM 9.0 8.4 8.9 8.6 8.7   Liver Function Tests:  Recent Labs Lab 09/18/14 0408  AST 28  ALT 14  ALKPHOS 128*  BILITOT 1.6*  PROT 6.5  ALBUMIN 2.9*   CBC:  Recent Labs Lab 09/17/14 1925 09/18/14 0408 09/19/14 0405  WBC 5.8 7.2 8.6  NEUTROABS  --  4.1  --   HGB 14.2 14.0 13.3  HCT 44.5 41.7 40.7  MCV 89.2 88.2 87.3  PLT 162 141* 142*   Cardiac Enzymes:  Recent Labs Lab 09/18/14 0010 09/18/14 0408 09/18/14 1218  TROPONINI 0.09* 0.08* 0.08*   CBG:  Recent Labs Lab 09/20/14 1628 09/20/14 2128 09/20/14 2312 09/21/14 0758 09/21/14 1131  GLUCAP 309* 162* 118* 110* 108*     IMAGING STUDIES Dg Chest 2 View  09/17/2014   CLINICAL DATA:  Cough.  Shortness of breath.  EXAM: CHEST  2 VIEW  COMPARISON:  Single frontal view 09/10/2013  FINDINGS: Cardiomegaly is again seen. There are bilateral pleural effusions and associated bibasilar airspace disease. This appears similar to prior exam. Minimal fluid is seen in the right minor fissure. There is vascular congestion and mild pulmonary  edema.  IMPRESSION: Findings consistent with congestive heart failure.   Electronically Signed   By: Rubye Oaks M.D.   On: 09/17/2014 19:29   Ct Head Wo Contrast  09/19/2014   CLINICAL DATA:  Slurred speech.  EXAM: CT HEAD WITHOUT CONTRAST  TECHNIQUE: Contiguous axial images were obtained from the base of the skull through the vertex without intravenous contrast.  COMPARISON:  CT scan of September 17, 2014.  FINDINGS: Bony calvarium appears intact. Mild diffuse cortical atrophy is noted. Mild chronic ischemic white matter disease is noted. No mass effect or midline shift is noted. Ventricular size is within normal limits. There is no evidence of mass lesion, hemorrhage or acute infarction.  IMPRESSION: Mild diffuse cortical atrophy. Mild chronic ischemic white matter disease. No acute intracranial abnormality seen.   Electronically Signed   By: Roque Lias M.D.   On: 09/19/2014 15:20   Ct Head Wo Contrast  09/17/2014   CLINICAL DATA:  Progressive weakness with dizziness and memory loss.  EXAM: CT HEAD WITHOUT CONTRAST  TECHNIQUE: Contiguous axial images were obtained from the base of the skull through the vertex without intravenous contrast.  COMPARISON:  10/29/2010  FINDINGS: No mass lesion. No midline shift. No acute hemorrhage or hematoma. No extra-axial fluid collections. No evidence of acute infarction. There  is severe diffuse cerebral cortical and cerebellar atrophy, most severe in the temporal and frontal lobes with secondary ventricular dilatation. There is fairly extensive periventricular white matter lucency consistent with chronic small vessel ischemic disease. Left globe prosthesis. No osseous abnormality.  IMPRESSION: No acute abnormality. Severe diffuse atrophy. Chronic small vessel ischemic changes.   Electronically Signed   By: Geanie Cooley M.D.   On: 09/17/2014 21:05    DISCHARGE EXAMINATION: Filed Vitals:   09/20/14 0420 09/20/14 1500 09/20/14 2133 09/21/14 0531  BP: 126/58  131/48 142/46 157/63  Pulse: 63 63 67 67  Temp: 98.9 F (37.2 C) 97.7 F (36.5 C) 98.1 F (36.7 C) 98.2 F (36.8 C)  TempSrc: Oral Oral Oral Oral  Resp: 18 16 22 20   Height:      Weight: 68.2 kg (150 lb 5.7 oz)   66.4 kg (146 lb 6.2 oz)  SpO2: 95% 100% 96% 98%   General appearance: alert, distracted and no distress Resp: clear to auscultation bilaterally Cardio: regular rate and rhythm, S1, S2 normal, no murmur, click, rub or gallop GI: soft, non-tender; bowel sounds normal; no masses,  no organomegaly  DISPOSITION: Home with daughter  Discharge Instructions    Call MD for:  difficulty breathing, headache or visual disturbances    Complete by:  As directed      Call MD for:  extreme fatigue    Complete by:  As directed      Call MD for:  persistant dizziness or light-headedness    Complete by:  As directed      Call MD for:  temperature >100.4    Complete by:  As directed      Diet - low sodium heart healthy    Complete by:  As directed      Diet Carb Modified    Complete by:  As directed      Discharge instructions    Complete by:  As directed   Check blood sugar as you have been doing. Increase the Lantus by 4 units daily if the morning blood sugar is greater than 150. Call her doctor if it stays above 250.     Increase activity slowly    Complete by:  As directed            ALLERGIES: No Known Allergies   Discharge Medication List as of 09/21/2014 11:35 AM    START taking these medications   Details  isosorbide mononitrate (IMDUR) 15 mg TB24 24 hr tablet Take 0.5 tablets (15 mg total) by mouth daily., Starting 09/21/2014, Until Discontinued, Print      CONTINUE these medications which have CHANGED   Details  furosemide (LASIX) 40 MG tablet Take one tablet twice daily for 5 days and then take one tablet once daily., Print    insulin glargine (LANTUS) 100 UNIT/ML injection Inject 0.2 mLs (20 Units total) into the skin at bedtime., Starting 09/21/2014, Until  Discontinued, Print      CONTINUE these medications which have NOT CHANGED   Details  albuterol (VENTOLIN HFA) 108 (90 BASE) MCG/ACT inhaler Inhale 2 puffs into the lungs every 6 (six) hours as needed for wheezing or shortness of breath., Until Discontinued, Historical Med    aspirin 81 MG chewable tablet Chew 81 mg by mouth daily., Until Discontinued, Historical Med    hydrALAZINE (APRESOLINE) 10 MG tablet Take 10 mg by mouth 3 (three) times daily., Until Discontinued, Historical Med    insulin lispro (HUMALOG) 100 UNIT/ML injection Inject  9 Units into the skin 3 (three) times daily with meals. Use as directed, Until Discontinued, Historical Med    levothyroxine (SYNTHROID, LEVOTHROID) 75 MCG tablet Take 75 mcg by mouth daily before breakfast., Until Discontinued, Historical Med    omeprazole (PRILOSEC) 20 MG capsule Take 1 capsule (20 mg total) by mouth daily., Starting 09/10/2013, Until Discontinued, No Print    ondansetron (ZOFRAN-ODT) 4 MG disintegrating tablet Take 4 mg by mouth every 8 (eight) hours as needed for nausea or vomiting., Until Discontinued, Historical Med      STOP taking these medications     carvedilol (COREG) 3.125 MG tablet      irbesartan (AVAPRO) 75 MG tablet      metFORMIN (GLUCOPHAGE) 1000 MG tablet      irbesartan (AVAPRO) 75 MG tablet        Follow-up Information    Follow up with Florentina Jenny, MD. Schedule an appointment as soon as possible for a visit in 1 week.   Specialty:  Family Medicine   Contact information:   [address] [place] [address] [phone]       Follow up with Cassell Clement, MD. Schedule an appointment as soon as possible for a visit in 1 month.   Specialty:  Cardiology   Why:  for heart failure   Contact information:   [address] [place] [address] [phone]       Follow up with [facility].   Specialty:  Home Health Services   Why:  Home Health RN   Contact  information:   [address] [place] [address] [phone]       TOTAL DISCHARGE TIME: 35 mins.  [facility]  [facility] Pager [phone]  09/21/2014, 3:02 PM

## 2014-09-26 ENCOUNTER — Ambulatory Visit (INDEPENDENT_AMBULATORY_CARE_PROVIDER_SITE_OTHER): Payer: Medicare Other | Admitting: Cardiology

## 2014-09-26 ENCOUNTER — Encounter: Payer: Self-pay | Admitting: Cardiology

## 2014-09-26 VITALS — BP 120/76 | HR 70

## 2014-09-26 DIAGNOSIS — I5022 Chronic systolic (congestive) heart failure: Secondary | ICD-10-CM

## 2014-09-26 DIAGNOSIS — I739 Peripheral vascular disease, unspecified: Secondary | ICD-10-CM

## 2014-09-26 DIAGNOSIS — N183 Chronic kidney disease, stage 3 unspecified: Secondary | ICD-10-CM

## 2014-09-26 DIAGNOSIS — E118 Type 2 diabetes mellitus with unspecified complications: Secondary | ICD-10-CM

## 2014-09-26 MED ORDER — ALBUTEROL SULFATE HFA 108 (90 BASE) MCG/ACT IN AERS: 2.0000 | INHALATION_SPRAY | Freq: Four times a day (QID) | RESPIRATORY_TRACT | Status: AC | PRN

## 2014-09-26 MED ORDER — FUROSEMIDE 20 MG PO TABS: 20.0000 mg | ORAL_TABLET | Freq: Two times a day (BID) | ORAL | Status: DC

## 2014-09-26 NOTE — Patient Instructions (Signed)
INCREASE YOUR LASIX (FUROSEMIDE) TO 20 MG TWICE A DAY  Your physician wants you to follow-up in: 3 MONTH OV You will receive a reminder letter in the mail two months in advance. If you don't receive a letter, please call our office to schedule the follow-up appointment.

## 2014-09-26 NOTE — Progress Notes (Signed)
[name] Date of Birth:  [birth year] [facility] [address] [place], [place]  [address] [phone]        Fax   [phone]   History of Present Illness:  HPI   This pleasant 81 year old woman is seen for the first time in the office today.  I had seen her as a unassigned hospital consult on 09/19/14 at [facility].  She was admitted there with slightly slurred speech.  He has a history of systolic heart failure and cardiomyopathy. She has a past history of hypertension, diabetes, bilateral BK amputations, and dementia. She also has a past history of chronic systolic heart failure. The family was not aware of any previous ischemic testing. She has a presumed nonischemic cardiomyopathy. Prior to her recent admission her previous echocardiogram was on 07/15/09 with results as noted below: 1. Left ventricle: The cavity size was normal. Wall thickness was  increased in a pattern of mild LVH. Systolic function was  severely reduced. The estimated ejection fraction was in the  range of 25% to 30%. Diffuse hypokinesis. Doppler parameters are  consistent with high ventricular filling pressure. 2. Aortic valve: Mild regurgitation. 3. Mitral valve: Calcified annulus. Severe regurgitation. 4. Left atrium: The atrium was moderately dilated. 5. Right ventricle: Systolic function was mildly to moderately  reduced. 6. Tricuspid valve: Moderate regurgitation. 7. Pulmonary arteries: Systolic pressure was moderately increased.  PA peak pressure: 57mm Hg (S). 8. Pericardium, extracardiac: A small pericardial effusion was  identified. There was a left pleural effusion.  On her December 2015 admission the patient had an echocardiogram on 09/18/14 with results as noted below: - Left ventricle: The cavity size was normal. Wall thickness was increased in a pattern of mild LVH. Systolic function was moderately to severely  reduced. The estimated ejection fraction was in the range of 30% to 35%. Diffuse hypokinesis. There is hypokinesis of the entireinferior myocardium. Doppler parameters are consistent with restrictive physiology, indicative of decreased left ventricular diastolic compliance and/or increased left atrial pressure. Doppler parameters are consistent with high ventricular filling pressure. - Aortic valve: There was mild regurgitation. - Mitral valve: Calcified annulus. Mildly thickened leaflets . There was mild regurgitation. - Left atrium: The atrium was mildly dilated. - Right ventricle: The cavity size was mildly dilated. - Right atrium: The atrium was moderately dilated. - Pulmonary arteries: PA peak pressure: 33 mm Hg (S).  The patient denies any history of chest discomfort. She is sedentary because of her bilateral below-knee amputations. She has had variable degrees of shortness of breath. At home she has been on Lasix 20 mg daily and depending on her symptoms she will sometimes be given a second Lasix if excess fluid is suggested by her physical examination. Dr. Florentina Jenny is her PCP and his staff makes home visits to her home. She has a very attentive family with whom she lives.  Since discharge from the hospital the home health nurses from [facility] have also been checking on her. Current Outpatient Prescriptions  Medication Sig Dispense Refill  . albuterol (VENTOLIN HFA) 108 (90 BASE) MCG/ACT inhaler Inhale 2 puffs into the lungs every 6 (six) hours as needed for wheezing or shortness of breath. 6.7 g 1  . aspirin 81 MG chewable tablet Chew 81 mg by mouth daily.    . B-D ULTRAFINE III SHORT PEN 31G X 8 MM MISC     . furosemide (LASIX) 20 MG tablet Take 1 tablet (20 mg  total) by mouth 2 (two) times daily. 60 tablet 5  . hydrALAZINE (APRESOLINE) 10 MG tablet Take 10 mg by mouth 3 (three) times daily.    . insulin glargine (LANTUS) 100 UNIT/ML injection Inject 0.2 mLs  (20 Units total) into the skin at bedtime. 10 mL 11  . insulin lispro (HUMALOG) 100 UNIT/ML injection Inject 9 Units into the skin 3 (three) times daily with meals. Use as directed    . irbesartan (AVAPRO) 75 MG tablet Take 75 mg by mouth every other day.    . isosorbide mononitrate (IMDUR) 30 MG 24 hr tablet Take 30 mg by mouth daily. Take 1/2 tablet daily.    Marland Kitchen levothyroxine (SYNTHROID, LEVOTHROID) 75 MCG tablet Take 75 mcg by mouth daily before breakfast.    . omeprazole (PRILOSEC) 20 MG capsule Take 1 capsule (20 mg total) by mouth daily.    . ondansetron (ZOFRAN-ODT) 4 MG disintegrating tablet Take 4 mg by mouth every 8 (eight) hours as needed for nausea or vomiting.    . ONE TOUCH ULTRA TEST test strip   3  . ONETOUCH DELICA LANCETS 33G MISC   3   No current facility-administered medications for this visit.    No Known Allergies  Patient Active Problem List   Diagnosis Date Noted  . Weakness 09/17/2014  . Slurred speech 09/17/2014  . Acute renal failure 09/17/2014  . Diabetes mellitus type 2, uncontrolled 09/17/2014  . Hypothyroidism 09/17/2014  . Bradycardia 09/17/2014  . Dyspnea 09/10/2013  . Tracheal deviation 09/10/2013  . DIABETIC FOOT ULCER, RIGHT 08/18/2009  . ARTHROPATHY ASSOCIATED W/NEUROLOGICAL DISORDERS 08/18/2009  . CHRONIC OSTEOMYELITIS ANKLE AND FOOT 08/18/2009  . DIARRHEA 08/18/2009  . HYPOTHYROIDISM 08/01/2009  . DIABETES MELLITUS, TYPE II 08/01/2009  . DEMENTIA 08/01/2009  . ANXIETY DEPRESSION 08/01/2009  . HYPERTENSION, UNSPECIFIED 08/01/2009  . CARDIOMYOPATHY 08/01/2009  . CONGESTIVE HEART FAILURE, ACUTE, SYSTOLIC 08/01/2009  . PERIPHERAL VASCULAR DISEASE 08/01/2009  . HYPOKALEMIA, HX OF 08/01/2009  . ACUTE OSTEOMYELITIS, ANKLE AND FOOT 07/30/2009    History  Smoking status  . Never Smoker   Smokeless tobacco  . Never Used    History  Alcohol Use No    Comment: quit in 1982     Family History  Problem Relation Age of Onset  . Diabetes  Mother   . Alzheimer's disease Mother   . Diabetes Father   . Alzheimer's disease Father     Review of Systems: Constitutional: no fever chills diaphoresis or fatigue or change in weight.  Head and neck: no hearing loss, no epistaxis, no photophobia or visual disturbance. Respiratory: No cough, shortness of breath or wheezing. Cardiovascular: No chest pain peripheral edema, palpitations. Gastrointestinal: No abdominal distention, no abdominal pain, no change in bowel habits hematochezia or melena. Genitourinary: No dysuria, no frequency, no urgency, no nocturia. Musculoskeletal:No arthralgias, no back pain, no gait disturbance or myalgias. Neurological: No dizziness, no headaches, no numbness, no seizures, no syncope, no weakness, no tremors. Hematologic: No lymphadenopathy, no easy bruising. Psychiatric: No confusion, no hallucinations, no sleep disturbance.   Wt Readings from Last 3 Encounters:  09/21/14 146 lb 6.2 oz (66.4 kg)  08/20/09 159 lb (72.122 kg)  08/18/09 162 lb (73.483 kg)    Physical Exam: Filed Vitals:   09/26/14 1409  BP: 120/76  Pulse: 70   the general appearance reveals a pleasant elderly woman who is wheelchair-bound.  She has bilateral BK amputations. The patient appears to be in no distress.  Head and neck exam:  Mouth and pharynx are benign.  No lymphadenopathy.  No carotid bruits.  The jugular venous pressure is normal.  Thyroid is not enlarged or tender.  Chest is clear to percussion and auscultation.  No rales or rhonchi.  Expansion of the chest is symmetrical.  Heart reveals no abnormal lift or heave.  First and second heart sounds are normal.  There is no murmur gallop rub or click.  The rhythm is regular  The abdomen is soft and nontender.  Bowel sounds are normoactive.  There is no hepatosplenomegaly or mass.  There are no abdominal bruits.  Extremities reveal bilateral BK amputations  Neurologic exam is normal strength and no lateralizing  weakness.  No sensory deficits.  There is some element of dementia  Integument reveals no rash  Assessment / Plan: 1. Combined Systolic and diastolic chronic congestive heart failure with pulmonary vascular congestion and dyspnea. Not on carvedilol now because of marked sinus bradycardia.  Presently she appears to be euvolemic 2. Essential hypertension 3. Diabetes mellitus, with peripheral arterial occlusive disease and prior BK amputations 4. Dementia 5.  Chronic kidney disease stage III 6.  COPD  Disposition: Continue current medication.  We will continue her at Lasix 20 mg twice a day.  She appears to be currently euvolemic.  Recheck in 3 months for follow-up office visit and EKG

## 2014-12-27 ENCOUNTER — Ambulatory Visit: Payer: Medicare Other | Admitting: Cardiology

## 2015-01-19 ENCOUNTER — Other Ambulatory Visit: Payer: Self-pay | Admitting: Internal Medicine

## 2015-01-20 NOTE — Telephone Encounter (Signed)
Pt not seen since 2014, defer to PCP

## 2015-02-14 ENCOUNTER — Ambulatory Visit (INDEPENDENT_AMBULATORY_CARE_PROVIDER_SITE_OTHER): Payer: Medicare Other | Admitting: Cardiology

## 2015-02-14 ENCOUNTER — Encounter: Payer: Self-pay | Admitting: Cardiology

## 2015-02-14 VITALS — BP 130/70 | HR 65 | Ht <= 58 in

## 2015-02-14 DIAGNOSIS — I5022 Chronic systolic (congestive) heart failure: Secondary | ICD-10-CM

## 2015-02-14 DIAGNOSIS — N183 Chronic kidney disease, stage 3 unspecified: Secondary | ICD-10-CM

## 2015-02-14 DIAGNOSIS — I739 Peripheral vascular disease, unspecified: Secondary | ICD-10-CM

## 2015-02-14 DIAGNOSIS — E118 Type 2 diabetes mellitus with unspecified complications: Secondary | ICD-10-CM

## 2015-02-14 NOTE — Patient Instructions (Signed)
Medication Instructions:  Your physician recommends that you continue on your current medications as directed. Please refer to the Current Medication list given to you today.  Labwork: none  Testing/Procedures: none  Follow-Up: Your physician wants you to follow-up in: 6 month ov You will receive a reminder letter in the mail two months in advance. If you don't receive a letter, please call our office to schedule the follow-up appointment.    

## 2015-02-14 NOTE — Progress Notes (Signed)
Cardiology Office Note   Date:  02/14/2015   ID:  [name], DOB [birth year], MRN [id number]  PCP:  Florentina JennyRIPP, HENRY, MD  Cardiologist: Cassell Clementhomas Cindia Hustead MD  No chief complaint on file.     History of Present Illness: Aimee DellMary E Madden is a 81 y.o. female who presents for a scheduled 6 month follow-up office visit   I had originally seen her as a unassigned hospital consult on [facility]. She was admitted there with slightly slurred speech. He has a history of systolic heart failure and cardiomyopathy. She has a past history of hypertension, diabetes, bilateral BK amputations, and dementia. She also has a past history of chronic systolic heart failure. The family was not aware of any previous ischemic testing. She has a presumed nonischemic cardiomyopathy. Prior to her recent admission her previous echocardiogram was on 07/15/09 with results as noted below: 1. Left ventricle: The cavity size was normal. Wall thickness was  increased in a pattern of mild LVH. Systolic function was  severely reduced. The estimated ejection fraction was in the  range of 25% to 30%. Diffuse hypokinesis. Doppler parameters are  consistent with high ventricular filling pressure. 2. Aortic valve: Mild regurgitation. 3. Mitral valve: Calcified annulus. Severe regurgitation. 4. Left atrium: The atrium was moderately dilated. 5. Right ventricle: Systolic function was mildly to moderately  reduced. 6. Tricuspid valve: Moderate regurgitation. 7. Pulmonary arteries: Systolic pressure was moderately increased.  PA peak pressure: 57mm Hg (S). 8. Pericardium, extracardiac: A small pericardial effusion was  identified. There was a left pleural effusion.  On her December 2015 admission the patient had an echocardiogram on 09/18/14 with results as noted below: - Left ventricle: The cavity size was normal. Wall thickness was increased in a pattern of mild  LVH. Systolic function was moderately to severely reduced. The estimated ejection fraction was in the range of 30% to 35%. Diffuse hypokinesis. There is hypokinesis of the entireinferior myocardium. Doppler parameters are consistent with restrictive physiology, indicative of decreased left ventricular diastolic compliance and/or increased left atrial pressure. Doppler parameters are consistent with high ventricular filling pressure. - Aortic valve: There was mild regurgitation. - Mitral valve: Calcified annulus. Mildly thickened leaflets . There was mild regurgitation. - Left atrium: The atrium was mildly dilated. - Right ventricle: The cavity size was mildly dilated. - Right atrium: The atrium was moderately dilated. - Pulmonary arteries: PA peak pressure: 33 mm Hg (S).  The patient denies any history of chest discomfort. She is sedentary because of her bilateral below-knee amputations. She has had variable degrees of shortness of breath. At home she has been on Lasix 20 mg twice a day 4 days a week and 40 mg twice a day on Monday Wednesday and Friday. Dr. Florentina JennyHenry Tripp is her PCP and his staff makes home visits to her home. She has a very attentive family with whom she lives.   Past Medical History  Diagnosis Date  . Systolic CHF, acute   . Cardiomyopathy     presumed nonischemic  . PVD (peripheral vascular disease)   . HTN (hypertension)   . DM2 (diabetes mellitus, type 2)   . Hypothyroidism   . Dementia   . Anxiety and depression   . Hypokalemia   . Acute osteomyelitis, ankle and foot   . Diabetic Charcot foot     history of    Past Surgical History  Procedure Laterality Date  . Enucleation  1972  . Rectal polypectomy    .  Amputation lower extremities       Current Outpatient Prescriptions  Medication Sig Dispense Refill  . albuterol (VENTOLIN HFA) 108 (90 BASE) MCG/ACT inhaler Inhale 2 puffs into the lungs every 6 (six) hours as needed for  wheezing or shortness of breath. 6.7 g 1  . aspirin 81 MG chewable tablet Chew 81 mg by mouth daily.    . B-D ULTRAFINE III SHORT PEN 31G X 8 MM MISC     . cetirizine (ZYRTEC) 10 MG tablet Take 10 mg by mouth daily.    . furosemide (LASIX) 20 MG tablet Take 20 mg by mouth as directed. Take 2 tablets by mouth twice a day on Monday, Wednesday, and Friday and 1 tablet twice a day on other days    . hydrALAZINE (APRESOLINE) 10 MG tablet Take 10 mg by mouth 3 (three) times daily.    . insulin glargine (LANTUS) 100 UNIT/ML injection Inject 0.2 mLs (20 Units total) into the skin at bedtime. 10 mL 11  . insulin lispro (HUMALOG) 100 UNIT/ML injection Inject 9 Units into the skin 3 (three) times daily with meals. Use as directed    . irbesartan (AVAPRO) 75 MG tablet Take 75 mg by mouth every other day.    . isosorbide mononitrate (IMDUR) 30 MG 24 hr tablet Take 15 mg by mouth daily. Take 1/2 tablet daily.    Marland Kitchen levothyroxine (SYNTHROID, LEVOTHROID) 75 MCG tablet Take 75 mcg by mouth daily before breakfast.    . omeprazole (PRILOSEC) 20 MG capsule Take 1 capsule (20 mg total) by mouth daily.    . ondansetron (ZOFRAN-ODT) 4 MG disintegrating tablet Take 4 mg by mouth every 8 (eight) hours as needed for nausea or vomiting.    . ONE TOUCH ULTRA TEST test strip   3  . ONETOUCH DELICA LANCETS 33G MISC   3   No current facility-administered medications for this visit.    Allergies:   Review of patient's allergies indicates no known allergies.    Social History:  The patient  reports that she has never smoked. She has never used smokeless tobacco. She reports that she does not drink alcohol or use illicit drugs.   Family History:  The patient's family history includes Alzheimer's disease in her father and mother; Diabetes in her father and mother.    ROS:  Please see the history of present illness.   Otherwise, review of systems are positive for none.   All other systems are reviewed and negative.     PHYSICAL EXAM: VS:  BP 130/70 mmHg  Pulse 65  Ht  (1.397 m)  Wt  , BMI There is no weight on file to calculate BMI. GEN: Well nourished, well developed, in no acute distress HEENT: normal Neck: no JVD, carotid bruits, or masses Cardiac: RRR; no murmurs, rubs, or gallops,no edema.  Bilateral lower leg amputations.  Respiratory:  clear to auscultation bilaterally, normal work of breathing GI: soft, nontender, nondistended, + BS MS: no deformity or atrophy Skin: warm and dry, no rash Neuro:  Strength and sensation are intact Psych: euthymic mood, full affect   EKG:  EKG is ordered today. The ekg ordered today demonstrates normal sinus rhythm with right bundle branch block and left posterior fascicular block.  Since previous tracing of 09/17/14, no significant change   Recent Labs: 09/17/2014: B Natriuretic Peptide 1378.7* 09/18/2014: ALT 14; TSH 1.632 09/19/2014: Hemoglobin 13.3; Platelets 142* 09/21/2014: BUN 24*; Creatinine 1.49*; Potassium 3.6; Sodium 136    Lipid  Panel    Component Value Date/Time   CHOL 82 09/18/2014 0408   TRIG 56 09/18/2014 0408   HDL 27* 09/18/2014 0408   CHOLHDL 3.0 09/18/2014 0408   VLDL 11 09/18/2014 0408   LDLCALC 44 09/18/2014 0408      Wt Readings from Last 3 Encounters:  09/21/14 146 lb 6.2 oz (66.4 kg)  08/20/09 159 lb (72.122 kg)  08/18/09 162 lb (73.483 kg)        ASSESSMENT AND PLAN:  1. Combined Systolic and diastolic chronic congestive heart failure with pulmonary vascular congestion and dyspnea. Not on carvedilol now because of marked sinus bradycardia.  Presently she appears to be euvolemic 2. Essential hypertension 3. Diabetes mellitus, with peripheral arterial occlusive disease and prior BK amputations 4. Dementia 5. Chronic kidney disease stage III 6. COPD  Disposition: Until new current medication.  She appears to be euvolemic.  Recheck in 6 months for follow-up office visit  Current medicines are  reviewed at length with the patient today.  The patient does not have concerns regarding medicines.  The following changes have been made:  no change  Labs/ tests ordered today include:   Orders Placed This Encounter  Procedures  . EKG 12-Lead       Signed, Cassell Clement MD 02/14/2015 6:19 PM    [facility] [address] [place], [place], [place]  [address] Phone: [phone]; Fax: [phone]

## 2015-03-29 ENCOUNTER — Other Ambulatory Visit: Payer: Self-pay | Admitting: Cardiology

## 2015-03-31 ENCOUNTER — Encounter: Payer: Self-pay | Admitting: *Deleted

## 2015-06-11 ENCOUNTER — Emergency Department (HOSPITAL_COMMUNITY)
Admission: EM | Admit: 2015-06-11 | Discharge: 2015-06-21 | Disposition: E | Payer: Medicare Other | Attending: Emergency Medicine | Admitting: Emergency Medicine

## 2015-06-11 ENCOUNTER — Emergency Department (HOSPITAL_COMMUNITY): Payer: Medicare Other

## 2015-06-11 DIAGNOSIS — Z79899 Other long term (current) drug therapy: Secondary | ICD-10-CM | POA: Diagnosis not present

## 2015-06-11 DIAGNOSIS — I469 Cardiac arrest, cause unspecified: Secondary | ICD-10-CM | POA: Insufficient documentation

## 2015-06-11 DIAGNOSIS — Z7982 Long term (current) use of aspirin: Secondary | ICD-10-CM | POA: Diagnosis not present

## 2015-06-11 DIAGNOSIS — E039 Hypothyroidism, unspecified: Secondary | ICD-10-CM | POA: Diagnosis not present

## 2015-06-11 DIAGNOSIS — I10 Essential (primary) hypertension: Secondary | ICD-10-CM | POA: Insufficient documentation

## 2015-06-11 DIAGNOSIS — E119 Type 2 diabetes mellitus without complications: Secondary | ICD-10-CM | POA: Diagnosis not present

## 2015-06-11 DIAGNOSIS — F0390 Unspecified dementia without behavioral disturbance: Secondary | ICD-10-CM | POA: Insufficient documentation

## 2015-06-11 DIAGNOSIS — Z794 Long term (current) use of insulin: Secondary | ICD-10-CM | POA: Diagnosis not present

## 2015-06-11 DIAGNOSIS — R0681 Apnea, not elsewhere classified: Secondary | ICD-10-CM | POA: Insufficient documentation

## 2015-06-11 DIAGNOSIS — Z8659 Personal history of other mental and behavioral disorders: Secondary | ICD-10-CM | POA: Insufficient documentation

## 2015-06-11 DIAGNOSIS — Z8739 Personal history of other diseases of the musculoskeletal system and connective tissue: Secondary | ICD-10-CM | POA: Diagnosis not present

## 2015-06-11 LAB — COMPREHENSIVE METABOLIC PANEL
ALBUMIN: 2.3 g/dL — AB (ref 3.5–5.0)
ALT: 28 U/L (ref 14–54)
AST: 117 U/L — AB (ref 15–41)
Alkaline Phosphatase: 143 U/L — ABNORMAL HIGH (ref 38–126)
Anion gap: 15 (ref 5–15)
BUN: 35 mg/dL — AB (ref 6–20)
CHLORIDE: 103 mmol/L (ref 101–111)
CO2: 15 mmol/L — AB (ref 22–32)
CREATININE: 1.79 mg/dL — AB (ref 0.44–1.00)
Calcium: 9.1 mg/dL (ref 8.9–10.3)
GFR calc Af Amer: 29 mL/min — ABNORMAL LOW (ref >60)
GFR, EST NON AFRICAN AMERICAN: 25 mL/min — AB (ref >60)
GLUCOSE: 355 mg/dL — AB (ref 65–99)
POTASSIUM: 4.2 mmol/L (ref 3.5–5.1)
SODIUM: 133 mmol/L — AB (ref 135–145)
Total Bilirubin: 1.5 mg/dL — ABNORMAL HIGH (ref 0.3–1.2)
Total Protein: 5.5 g/dL — ABNORMAL LOW (ref 6.5–8.1)

## 2015-06-11 LAB — I-STAT CHEM 8, ED
BUN: 45 mg/dL — AB (ref 6–20)
CALCIUM ION: 1.2 mmol/L (ref 1.13–1.30)
CHLORIDE: 103 mmol/L (ref 101–111)
CREATININE: 1.5 mg/dL — AB (ref 0.44–1.00)
Glucose, Bld: 350 mg/dL — ABNORMAL HIGH (ref 65–99)
HCT: 45 % (ref 36.0–46.0)
Hemoglobin: 15.3 g/dL — ABNORMAL HIGH (ref 12.0–15.0)
Potassium: 4 mmol/L (ref 3.5–5.1)
SODIUM: 135 mmol/L (ref 135–145)
TCO2: 17 mmol/L (ref 0–100)

## 2015-06-11 LAB — CBC WITH DIFFERENTIAL/PLATELET
BASOS ABS: 0 10*3/uL (ref 0.0–0.1)
Basophils Relative: 0 %
EOS ABS: 0.2 10*3/uL (ref 0.0–0.7)
EOS PCT: 1 %
HCT: 41.2 % (ref 36.0–46.0)
Hemoglobin: 13.1 g/dL (ref 12.0–15.0)
LYMPHS ABS: 8.4 10*3/uL — AB (ref 0.7–4.0)
Lymphocytes Relative: 49 %
MCH: 29.4 pg (ref 26.0–34.0)
MCHC: 31.8 g/dL (ref 30.0–36.0)
MCV: 92.4 fL (ref 78.0–100.0)
MONO ABS: 0.7 10*3/uL (ref 0.1–1.0)
Monocytes Relative: 4 %
NEUTROS PCT: 46 %
Neutro Abs: 8 10*3/uL — ABNORMAL HIGH (ref 1.7–7.7)
PLATELETS: 130 10*3/uL — AB (ref 150–400)
RBC: 4.46 MIL/uL (ref 3.87–5.11)
RDW: 16.9 % — AB (ref 11.5–15.5)
WBC: 17.3 10*3/uL — AB (ref 4.0–10.5)

## 2015-06-11 LAB — CBG MONITORING, ED: GLUCOSE-CAPILLARY: 280 mg/dL — AB (ref 65–99)

## 2015-06-11 LAB — I-STAT TROPONIN, ED: Troponin i, poc: 0.16 ng/mL (ref 0.00–0.08)

## 2015-06-11 LAB — PHOSPHORUS: Phosphorus: 5 mg/dL — ABNORMAL HIGH (ref 2.5–4.6)

## 2015-06-11 LAB — I-STAT CG4 LACTIC ACID, ED: Lactic Acid, Venous: 7.98 mmol/L (ref 0.5–2.0)

## 2015-06-11 LAB — PATHOLOGIST SMEAR REVIEW

## 2015-06-11 LAB — TROPONIN I: TROPONIN I: 0.18 ng/mL — AB (ref <0.031)

## 2015-06-11 LAB — MAGNESIUM: MAGNESIUM: 2.6 mg/dL — AB (ref 1.7–2.4)

## 2015-06-11 MED ORDER — SODIUM BICARBONATE 8.4 % IV SOLN
INTRAVENOUS | Status: AC | PRN
  Administered 2015-06-11: 50 meq via INTRAVENOUS

## 2015-06-11 MED ORDER — DEXTROSE 5 % IV SOLN
300.0000 mg | INTRAVENOUS | Status: AC | PRN
  Administered 2015-06-11: 300 mg via INTRAVENOUS

## 2015-06-11 MED ORDER — EPINEPHRINE HCL 0.1 MG/ML IJ SOSY
PREFILLED_SYRINGE | INTRAMUSCULAR | Status: AC | PRN
  Administered 2015-06-11 (×6): 1 mg via INTRAVENOUS

## 2015-06-11 MED ORDER — SODIUM CHLORIDE 0.9 % IV SOLN
INTRAVENOUS | Status: AC | PRN
  Administered 2015-06-11 (×2): 1000 mL via INTRAVENOUS

## 2015-06-11 MED ORDER — CALCIUM CHLORIDE 10 % IV SOLN
INTRAVENOUS | Status: AC | PRN
  Administered 2015-06-11: 1 g via INTRAVENOUS

## 2015-06-11 MED FILL — Medication: Qty: 1 | Status: AC

## 2015-06-21 NOTE — Code Documentation (Signed)
Total Code time pta to ed is 45 mins

## 2015-06-21 NOTE — ED Notes (Signed)
See code narrator 

## 2015-06-21 NOTE — Code Documentation (Signed)
EMS arrived, pt down approx 20 mins, still warm, CPR initiated by FIRE, V Fib noted on monitor, 4 mins of CPR and then pt went to PEA, Given 3 total EPI and D50 and then obtain ROSC. 140/100 with ROSC hr 106, set pt on pacer and loading in truck and capnographty (obtained with KING airway) dropped from 36 to 27 and loss of pulses noted, given another EPI and then pt here. Hx of CHF, DM, Open heart sx, and 16 left EJ by ems.

## 2015-06-21 NOTE — ED Provider Notes (Signed)
CSN: [id number]     Arrival date & time 29-Jun-2015  0052 History   First MD Initiated Contact with Patient June 29, 2015 0114     Chief Complaint  Patient presents with  . Cardiac Arrest     (Consider location/radiation/quality/duration/timing/severity/associated sxs/prior Treatment) HPI Level 5 caveat for acuity 81 y.o. female presents in cardiac arrest.  Pt was reportedly in her normal state of health, was unwitnessed for approximately 15 minutes and on return of family was found unresponsive. On EMS arrival the patient was pulseless, apneic. She received 1 round of compressions and epinephrine with return of spontaneous circulation. As the patient was loaded into the ambulance she developed cardiac arrest, unknown rhythm at that time. This persisted until arrival in the emergency department. Past Medical History  Diagnosis Date  . Systolic CHF, acute   . Cardiomyopathy     presumed nonischemic  . PVD (peripheral vascular disease)   . HTN (hypertension)   . DM2 (diabetes mellitus, type 2)   . Hypothyroidism   . Dementia   . Anxiety and depression   . Hypokalemia   . Acute osteomyelitis, ankle and foot   . Diabetic Charcot foot     history of   Past Surgical History  Procedure Laterality Date  . Enucleation  1972  . Rectal polypectomy    . Amputation lower extremities     Family History  Problem Relation Age of Onset  . Diabetes Mother   . Alzheimer's disease Mother   . Diabetes Father   . Alzheimer's disease Father    Social History  Substance Use Topics  . Smoking status: Never Smoker   . Smokeless tobacco: Never Used  . Alcohol Use: No     Comment: quit in 1982    OB History    No data available     Review of Systems  All other systems reviewed and are negative.     Allergies  Review of patient's allergies indicates no known allergies.  Home Medications   Prior to Admission medications   Medication Sig Start Date End Date Taking? Authorizing Provider   albuterol (VENTOLIN HFA) 108 (90 BASE) MCG/ACT inhaler Inhale 2 puffs into the lungs every 6 (six) hours as needed for wheezing or shortness of breath. 09/26/14   Cassell Clement, MD  aspirin 81 MG chewable tablet Chew 81 mg by mouth daily.    Historical Provider, MD  B-D ULTRAFINE III SHORT PEN 31G X 8 MM MISC  09/22/14   Historical Provider, MD  cetirizine (ZYRTEC) 10 MG tablet Take 10 mg by mouth daily.    Historical Provider, MD  furosemide (LASIX) 20 MG tablet Take 20 mg by mouth as directed. Take 2 tablets by mouth twice a day on Monday, Wednesday, and Friday and 1 tablet twice a day on other days    Historical Provider, MD  furosemide (LASIX) 20 MG tablet TAKE 1 TABLET BY MOUTH TWICE A DAY 04/01/15   Cassell Clement, MD  hydrALAZINE (APRESOLINE) 10 MG tablet Take 10 mg by mouth 3 (three) times daily.    Historical Provider, MD  insulin glargine (LANTUS) 100 UNIT/ML injection Inject 0.2 mLs (20 Units total) into the skin at bedtime. 09/21/14   Osvaldo Shipper, MD  insulin lispro (HUMALOG) 100 UNIT/ML injection Inject 9 Units into the skin 3 (three) times daily with meals. Use as directed    Historical Provider, MD  irbesartan (AVAPRO) 75 MG tablet Take 75 mg by mouth every other day. 09/16/14   Historical  Provider, MD  isosorbide mononitrate (IMDUR) 30 MG 24 hr tablet Take 15 mg by mouth daily. Take 1/2 tablet daily. 09/22/14   Historical Provider, MD  levothyroxine (SYNTHROID, LEVOTHROID) 75 MCG tablet Take 75 mcg by mouth daily before breakfast.    Historical Provider, MD  omeprazole (PRILOSEC) 20 MG capsule Take 1 capsule (20 mg total) by mouth daily. 09/10/13   Nyoka Cowden, MD  ondansetron (ZOFRAN-ODT) 4 MG disintegrating tablet Take 4 mg by mouth every 8 (eight) hours as needed for nausea or vomiting.    Historical Provider, MD  ONE TOUCH ULTRA TEST test strip  07/25/14   Historical Provider, MD  Dola Argyle LANCETS 33G MISC  07/26/14   Historical Provider, MD   BP 188/104 mmHg  Pulse 106   Wt 146 lb (66.225 kg)  SpO2 99% Physical Exam  Constitutional: She appears distressed. She is intubated (king).  HENT:  Head: Normocephalic.  Eyes: Conjunctivae are normal.  Neck: JVD present.  Cardiovascular:  Pulses:      Carotid pulses are 0 on the right side, and 0 on the left side.      Femoral pulses are 0 on the right side, and 0 on the left side. Pulmonary/Chest: Apnea noted. She is intubated (king). She is in respiratory distress.  Abdominal: She exhibits no distension.  Neurological: She is unresponsive. GCS eye subscore is 1. GCS verbal subscore is 1. GCS motor subscore is 1.    ED Course  INTUBATION Date/Time: 06/12/2015 12:05 AM Performed by: Mirian Mo Authorized by: Mirian Mo Consent: The procedure was performed in an emergent situation. Intubation method: video-assisted Patient status: unconscious Laryngoscope size: Mac 3 Tube size: 7.5 mm Tube type: cuffed Number of attempts: 1 Cricoid pressure: no Cords visualized: yes Post-procedure assessment: chest rise Breath sounds: equal Cuff inflated: yes ETT to lip: 25 cm Tube secured with: ETT holder and adhesive tape Chest x-ray interpreted by me, other physician and radiologist. Chest x-ray findings: endotracheal tube too low Tube repositioned: tube repositioned successfully   (including critical care time) Labs Review Labs Reviewed  CBC WITH DIFFERENTIAL/PLATELET - Abnormal; Notable for the following:    WBC 17.3 (*)    RDW 16.9 (*)    Platelets 130 (*)    Neutro Abs 8.0 (*)    Lymphs Abs 8.4 (*)    All other components within normal limits  COMPREHENSIVE METABOLIC PANEL - Abnormal; Notable for the following:    Sodium 133 (*)    CO2 15 (*)    Glucose, Bld 355 (*)    BUN 35 (*)    Creatinine, Ser 1.79 (*)    Total Protein 5.5 (*)    Albumin 2.3 (*)    AST 117 (*)    Alkaline Phosphatase 143 (*)    Total Bilirubin 1.5 (*)    GFR calc non Af Amer 25 (*)    GFR calc Af Amer 29 (*)     All other components within normal limits  TROPONIN I - Abnormal; Notable for the following:    Troponin I 0.18 (*)    All other components within normal limits  MAGNESIUM - Abnormal; Notable for the following:    Magnesium 2.6 (*)    All other components within normal limits  PHOSPHORUS - Abnormal; Notable for the following:    Phosphorus 5.0 (*)    All other components within normal limits  I-STAT CHEM 8, ED - Abnormal; Notable for the following:    BUN 45 (*)  Creatinine, Ser 1.50 (*)    Glucose, Bld 350 (*)    Hemoglobin 15.3 (*)    All other components within normal limits  I-STAT CG4 LACTIC ACID, ED - Abnormal; Notable for the following:    Lactic Acid, Venous 7.98 (*)    All other components within normal limits  I-STAT TROPOININ, ED - Abnormal; Notable for the following:    Troponin i, poc 0.16 (*)    All other components within normal limits  CBG MONITORING, ED - Abnormal; Notable for the following:    Glucose-Capillary 280 (*)    All other components within normal limits  PATHOLOGIST SMEAR REVIEW    Imaging Review Dg Chest Portable 1 View  2015/07/04   CLINICAL DATA:  81 year old female status post CPR.  EXAM: PORTABLE CHEST - 1 VIEW  COMPARISON:  Radiograph dated 09/17/2014  FINDINGS: Endotracheal tube at the level of the carina. Recommend retraction by approximately 4 cm to ovoid accidental extension into the right mainstem bronchus.  Single-view of the chest demonstrate diffuse bilateral interstitial prominence, possibly a degree of congestion or edema. A small left pleural effusion may be present. No focal consolidation or pneumothorax. There is cardiomegaly. The osseous structures are grossly unremarkable.  IMPRESSION: Cardiomegaly with bilateral interstitial and vascular prominence compatible for congestive changes or edema. Small left pleural effusion.  Endotracheal tube with tip at the level of the carina.   Electronically Signed   By: Elgie Collard M.D.   On:  07-04-15 02:01   I have personally reviewed and evaluated these images and lab results as part of my medical decision-making.   EKG Interpretation   Date/Time:  Wednesday 2015-07-04 01:34:37 EDT Ventricular Rate:  47 PR Interval:  97 QRS Duration: 155 QT Interval:  490 QTC Calculation: 433 R Axis:   38 Text Interpretation:  Idioventricular escape rhythm Nonspecific  intraventricular conduction delay Repol abnrm, severe global ischemia  (LM/MVD) Confirmed by Mirian Mo [phone]) on July 04, 2015 1:52:35 AM     Cardiopulmonary Resuscitation (CPR) Procedure Note Directed/Performed by: Mirian Mo I personally directed ancillary staff and/or performed CPR in an effort to regain return of spontaneous circulation and to maintain cardiac, neuro and systemic perfusion.    MDM   Final diagnoses:  Cardiac arrest    81 y.o. female with pertinent PMH of CAD, CHF, PVD presents in cardiac arrest.  On arrival patient pulseless, AB neck. King airway in place. ACLS carried out for approximately 2 rounds with epinephrine, calcium chloride given.  Patient had return of spontaneous circulation at that time. Shortly thereafter patient had V. tach cardiac arrest. Cardioversion performed with return to PEA.  Patient persistently in PDA after that time. I spoke with the family and with shared decision-making we agreed to cease resuscitative efforts. Patient declared deceased.  Time of death 1:52 AM.    I have reviewed all laboratory and imaging studies if ordered as above  1. Cardiac arrest         Mirian Mo, MD 06/12/15 [id number]

## 2015-06-21 NOTE — Code Documentation (Signed)
Family updated as to patient's status.

## 2015-06-21 NOTE — Progress Notes (Signed)
   2015/07/04 0154  Clinical Encounter Type  Visited With Family;Health care provider  Visit Type Initial;Spiritual support;Death;Code  Referral From Nurse  Spiritual Encounters  Spiritual Needs Prayer  Stress Factors  Family Stress Factors Loss   Chaplain met with patient's family in response to admission to ED with CPR. Chaplain offered prayer and spiritual support to family throughout patient's death. Chaplain support available as needed.   Jeri Lager, Chaplain 07/04/2015 1:56 AM

## 2015-06-21 DEATH — deceased

## 2015-08-20 ENCOUNTER — Ambulatory Visit: Payer: Medicare Other | Admitting: Cardiology
